# Patient Record
Sex: Female | Born: 1980 | Race: Black or African American | Hispanic: No | Marital: Married | Smoking: Never smoker
Health system: Southern US, Community
[De-identification: ages and names within clinical notes are randomized; demographics above are authoritative.]

## PROBLEM LIST (undated history)

## (undated) DIAGNOSIS — B999 Unspecified infectious disease: Secondary | ICD-10-CM

## (undated) HISTORY — PX: NO PAST SURGERIES: SHX2092

## (undated) HISTORY — DX: Unspecified infectious disease: B99.9

---

## 2010-09-23 LAB — HIV ANTIBODY (ROUTINE TESTING W REFLEX): HIV: NONREACTIVE

## 2010-09-23 LAB — HEPATITIS B SURFACE ANTIGEN: Hepatitis B Surface Ag: NEGATIVE

## 2010-09-23 LAB — GC/CHLAMYDIA PROBE AMP, GENITAL
Chlamydia: NEGATIVE
Gonorrhea: NEGATIVE

## 2010-09-23 LAB — ABO/RH: RH Type: POSITIVE

## 2010-09-23 LAB — RUBELLA ANTIBODY, IGM: Rubella: IMMUNE

## 2010-09-23 LAB — TYPE AND SCREEN: Antibody Screen: NEGATIVE

## 2010-10-20 NOTE — L&D Delivery Note (Signed)
Pt had a protracted early phase of labor.  Once in the active phase she progressed along a nl labor curve.  Second stage was complicated by variable decels. She had a svd over a 2nd degree perineal tear in ROA position.  Placenta S/I. EBL 400cc. Baby to NBN.

## 2010-11-24 ENCOUNTER — Inpatient Hospital Stay (HOSPITAL_COMMUNITY)
Admission: AD | Admit: 2010-11-24 | Discharge: 2010-11-24 | Disposition: A | Discharge status: Home / Self Care | Payer: 59 | Source: Ambulatory Visit | Attending: Obstetrics and Gynecology | Admitting: Obstetrics and Gynecology

## 2010-11-24 DIAGNOSIS — O2650 Maternal hypotension syndrome, unspecified trimester: Secondary | ICD-10-CM | POA: Insufficient documentation

## 2010-11-24 LAB — URINALYSIS, ROUTINE W REFLEX MICROSCOPIC
Bilirubin Urine: NEGATIVE
Hgb urine dipstick: NEGATIVE
Ketones, ur: NEGATIVE mg/dL
Leukocytes, UA: NEGATIVE
Nitrite: NEGATIVE
Protein, ur: 100 mg/dL — AB
Specific Gravity, Urine: 1.025 (ref 1.005–1.030)
Urine Glucose, Fasting: NEGATIVE mg/dL
Urobilinogen, UA: 0.2 mg/dL (ref 0.0–1.0)
pH: 6.5 (ref 5.0–8.0)

## 2010-11-24 LAB — URINE MICROSCOPIC-ADD ON

## 2010-11-27 LAB — URINE CULTURE
Colony Count: >=100000
Culture  Setup Time: 201202061002

## 2010-12-30 ENCOUNTER — Other Ambulatory Visit (HOSPITAL_COMMUNITY): Payer: Self-pay | Admitting: Obstetrics & Gynecology

## 2010-12-30 DIAGNOSIS — IMO0002 Reserved for concepts with insufficient information to code with codable children: Secondary | ICD-10-CM

## 2010-12-30 DIAGNOSIS — Z0489 Encounter for examination and observation for other specified reasons: Secondary | ICD-10-CM

## 2011-01-02 ENCOUNTER — Ambulatory Visit (HOSPITAL_COMMUNITY)
Admission: RE | Admit: 2011-01-02 | Discharge: 2011-01-02 | Disposition: A | Discharge status: Home / Self Care | Payer: 59 | Source: Ambulatory Visit | Attending: Obstetrics & Gynecology | Admitting: Obstetrics & Gynecology

## 2011-01-02 DIAGNOSIS — Z0489 Encounter for examination and observation for other specified reasons: Secondary | ICD-10-CM

## 2011-01-02 DIAGNOSIS — O358XX0 Maternal care for other (suspected) fetal abnormality and damage, not applicable or unspecified: Secondary | ICD-10-CM | POA: Insufficient documentation

## 2011-01-02 DIAGNOSIS — IMO0002 Reserved for concepts with insufficient information to code with codable children: Secondary | ICD-10-CM

## 2011-01-02 DIAGNOSIS — Z363 Encounter for antenatal screening for malformations: Secondary | ICD-10-CM | POA: Insufficient documentation

## 2011-01-02 DIAGNOSIS — Z1389 Encounter for screening for other disorder: Secondary | ICD-10-CM | POA: Insufficient documentation

## 2011-01-28 LAB — RPR
RPR: NONREACTIVE
RPR: NONREACTIVE

## 2011-03-25 IMAGING — US US OB DETAIL+14 WK
2 series · 14 of 28 positions shown · non-contrast
Comparison: none

[Series 1: us ob detail+14 wk · 1 of 7 slices shown (1 of 2)]
[im 7/7]
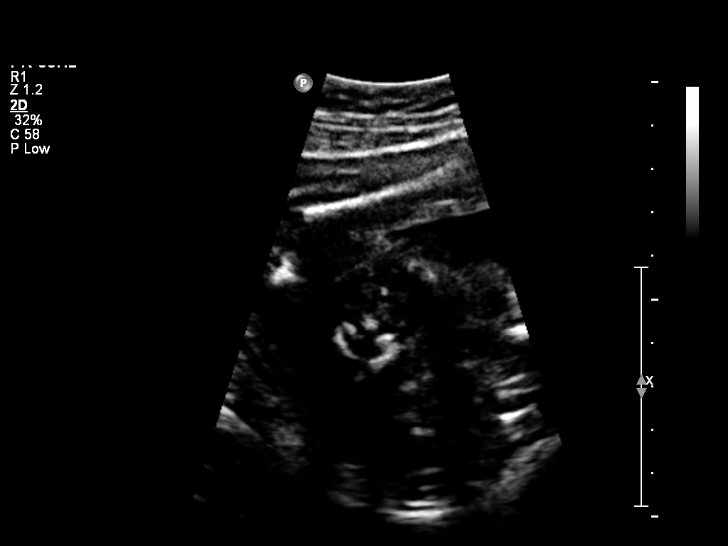

[Series 1: us ob detail+14 wk · 77 acquisitions, 13 frames shown (2 of 2)]
[im 4/77]
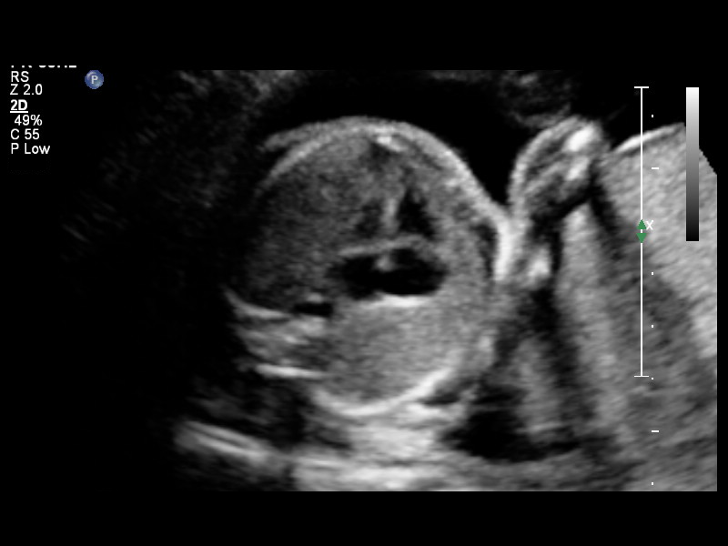
[im 10/77]
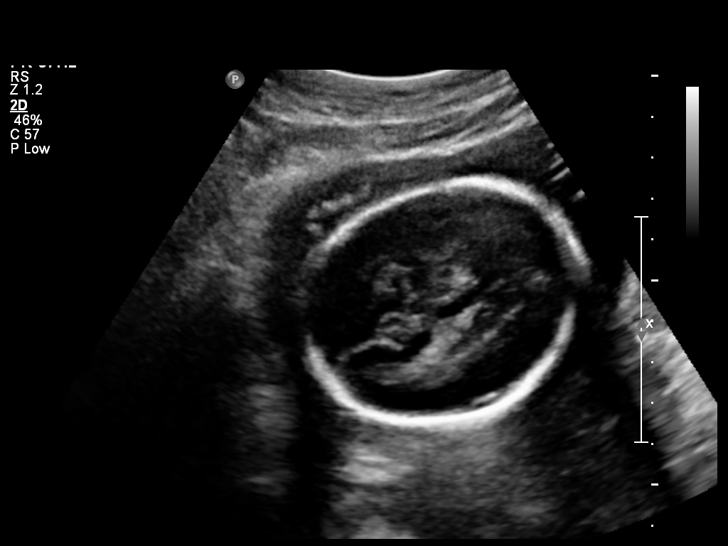
[im 16/77]
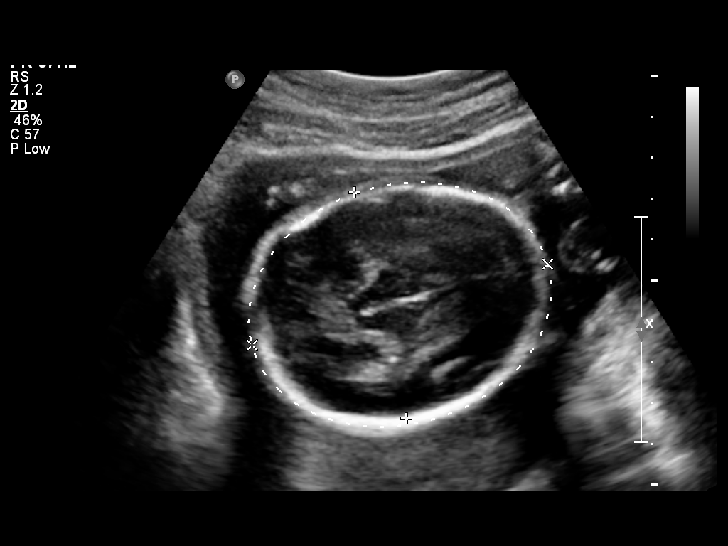
[im 22/77]
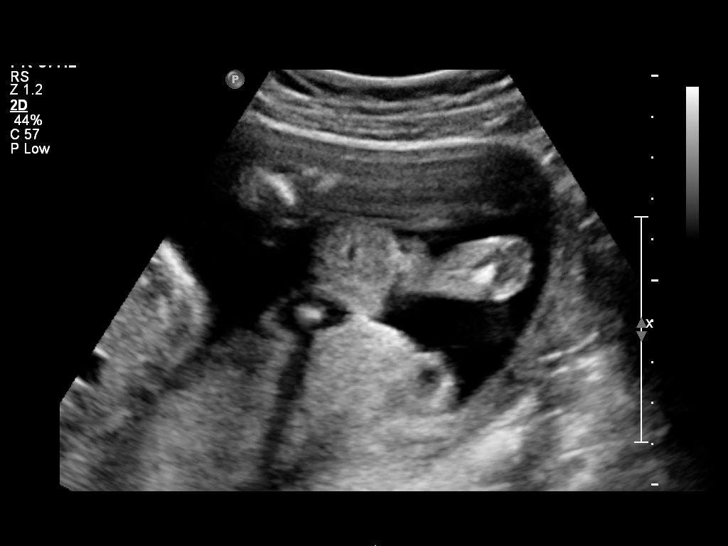
[im 28/77]
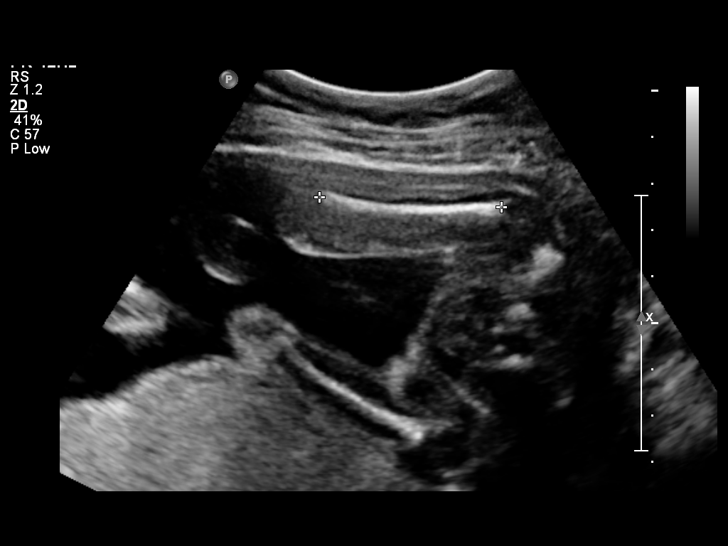
[im 34/77]
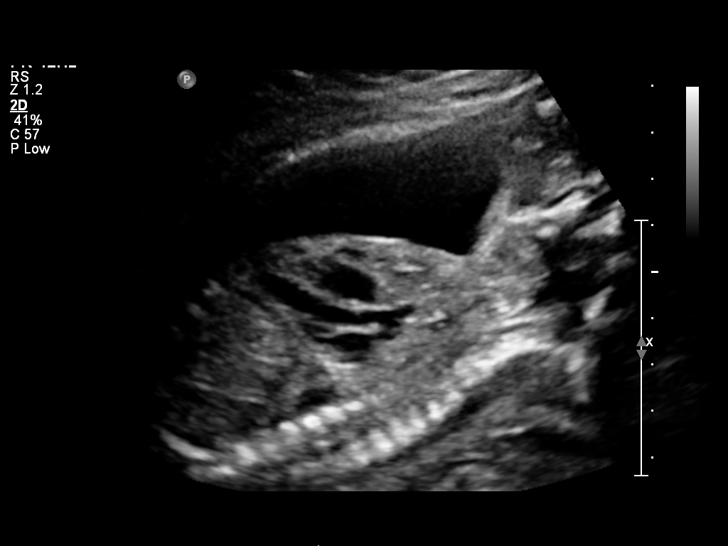
[im 40/77]
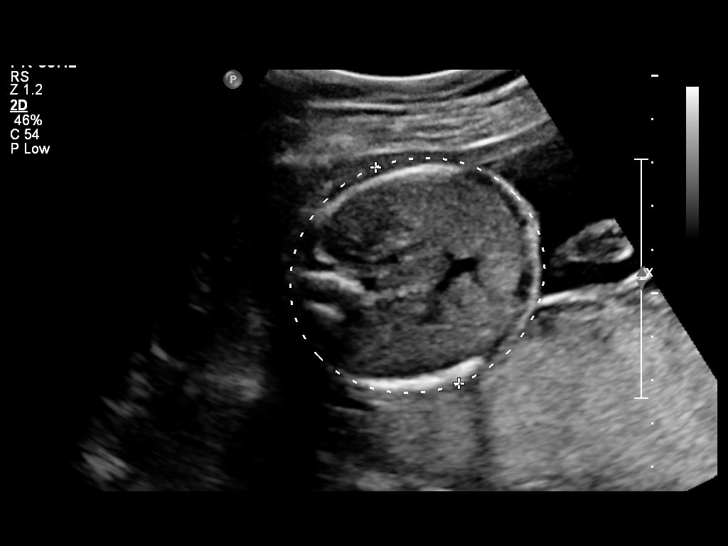
[im 46/77]
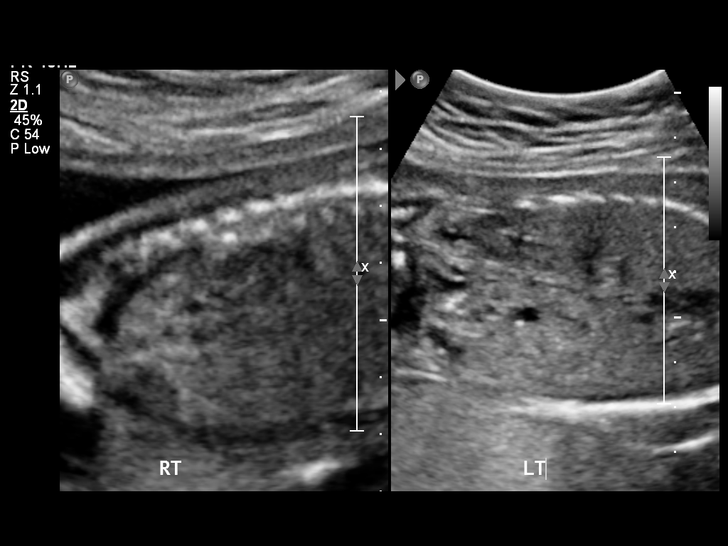
[im 52/77]
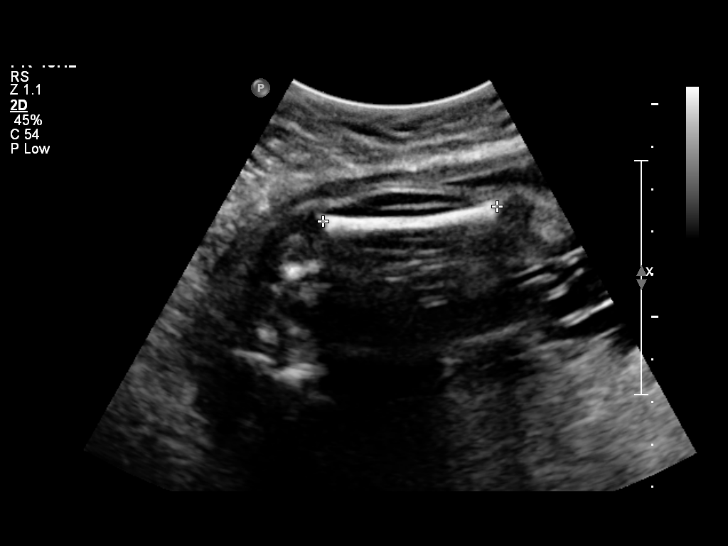
[im 58/77]
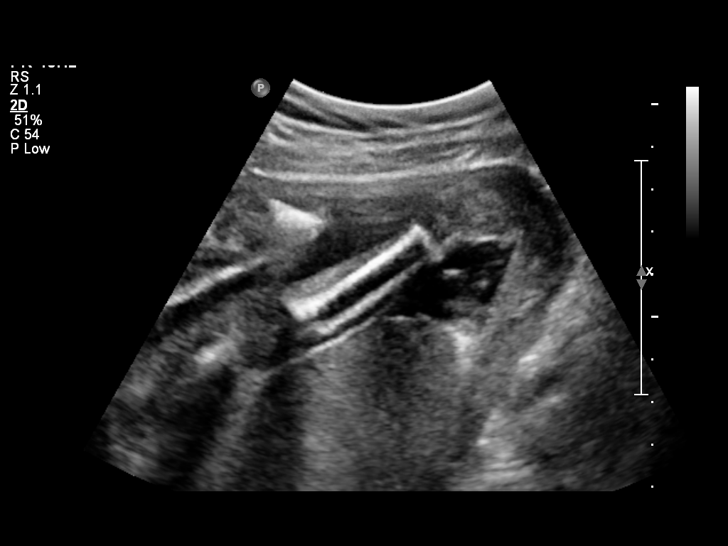
[im 64/77]
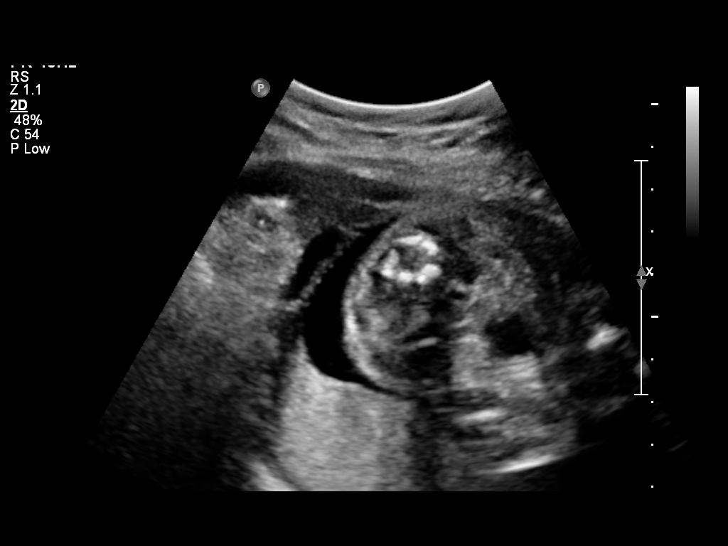
[im 70/77]
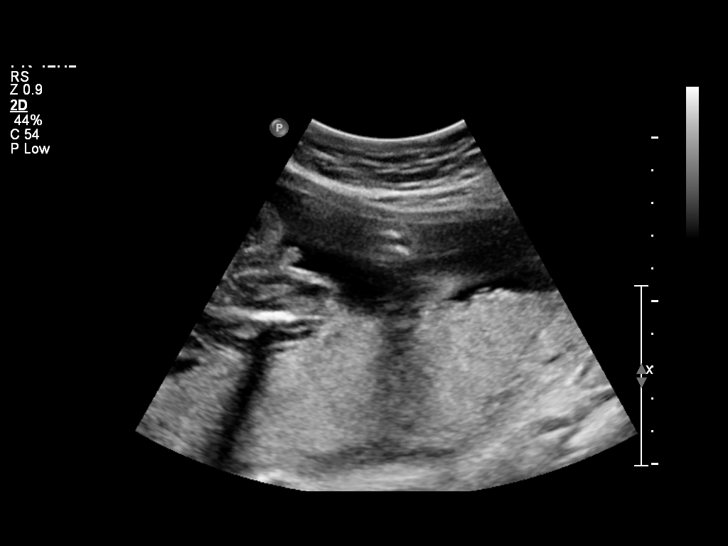
[im 77/77]
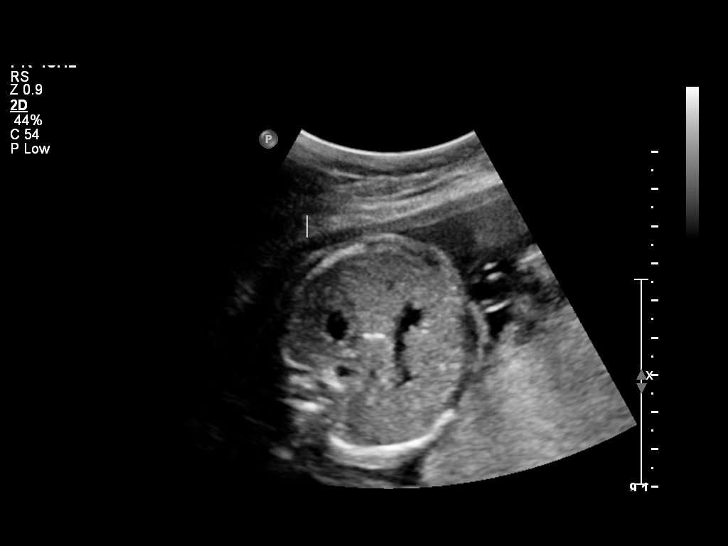

[14 of 28 positions shown; findings below may reference images not displayed]

Canned report from images found in remote index.

Refer to host system for actual result text.

## 2011-03-26 LAB — STREP B DNA PROBE
GBS: NEGATIVE
GBS: NEGATIVE
GBS: NEGATIVE
GBS: NEGATIVE
GBS: NEGATIVE

## 2011-05-04 ENCOUNTER — Inpatient Hospital Stay (HOSPITAL_COMMUNITY): Admission: RE | Admit: 2011-05-04 | Payer: 59 | Source: Ambulatory Visit

## 2011-05-04 ENCOUNTER — Encounter (HOSPITAL_COMMUNITY): Payer: Self-pay

## 2011-05-04 ENCOUNTER — Inpatient Hospital Stay (HOSPITAL_COMMUNITY)
Admission: AD | Admit: 2011-05-04 | Discharge: 2011-05-08 | DRG: 775 | Disposition: A | Discharge status: Home / Self Care | Payer: 59 | Source: Ambulatory Visit | Attending: Obstetrics and Gynecology | Admitting: Obstetrics and Gynecology

## 2011-05-04 LAB — CBC
HCT: 34.9 % — ABNORMAL LOW (ref 36.0–46.0)
Hemoglobin: 11.6 g/dL — ABNORMAL LOW (ref 12.0–15.0)
MCH: 29.6 pg (ref 26.0–34.0)
MCHC: 33.2 g/dL (ref 30.0–36.0)
MCV: 89 fL (ref 78.0–100.0)
Platelets: 265 10*3/uL (ref 150–400)
RBC: 3.92 MIL/uL (ref 3.87–5.11)
RDW: 13.8 % (ref 11.5–15.5)
WBC: 14 10*3/uL — ABNORMAL HIGH (ref 4.0–10.5)

## 2011-05-04 LAB — STREP B DNA PROBE
GBS: NEGATIVE
GBS: NEGATIVE

## 2011-05-04 LAB — ABO/RH

## 2011-05-04 MED ORDER — PHENYLEPHRINE 40 MCG/ML (10ML) SYRINGE FOR IV PUSH (FOR BLOOD PRESSURE SUPPORT)
80.0000 ug | PREFILLED_SYRINGE | INTRAVENOUS | Status: DC | PRN
  Filled 2011-05-04: qty 5

## 2011-05-04 MED ORDER — PHENYLEPHRINE 40 MCG/ML (10ML) SYRINGE FOR IV PUSH (FOR BLOOD PRESSURE SUPPORT)
80.0000 ug | PREFILLED_SYRINGE | INTRAVENOUS | Status: DC | PRN
  Filled 2011-05-04 (×2): qty 5

## 2011-05-04 MED ORDER — ACETAMINOPHEN 325 MG PO TABS: 650.0000 mg | ORAL_TABLET | ORAL | Status: DC | PRN

## 2011-05-04 MED ORDER — LACTATED RINGERS IV SOLN: 500.0000 mL | INTRAVENOUS | Status: DC | PRN

## 2011-05-04 MED ORDER — LACTATED RINGERS IV SOLN
500.0000 mL | Freq: Once | INTRAVENOUS | Status: AC
  Administered 2011-05-06: 500 mL via INTRAVENOUS

## 2011-05-04 MED ORDER — LIDOCAINE HCL (PF) 1 % IJ SOLN
30.0000 mL | INTRAMUSCULAR | Status: DC | PRN
  Filled 2011-05-04: qty 30

## 2011-05-04 MED ORDER — DIPHENHYDRAMINE HCL 50 MG/ML IJ SOLN: 12.5000 mg | INTRAMUSCULAR | Status: DC | PRN

## 2011-05-04 MED ORDER — MISOPROSTOL 25 MCG QUARTER TABLET
25.0000 ug | ORAL_TABLET | ORAL | Status: DC
  Administered 2011-05-04 – 2011-05-05 (×2): 25 ug via VAGINAL
  Filled 2011-05-04 (×4): qty 0.25

## 2011-05-04 MED ORDER — IBUPROFEN 600 MG PO TABS: 600.0000 mg | ORAL_TABLET | Freq: Four times a day (QID) | ORAL | Status: DC | PRN

## 2011-05-04 MED ORDER — ONDANSETRON HCL 4 MG/2ML IJ SOLN: 4.0000 mg | Freq: Four times a day (QID) | INTRAMUSCULAR | Status: DC | PRN

## 2011-05-04 MED ORDER — SODIUM CHLORIDE 0.9 % IJ SOLN
3.0000 mL | INTRAMUSCULAR | Status: DC | PRN
  Administered 2011-05-04: 3 mL via INTRAVENOUS

## 2011-05-04 MED ORDER — NALBUPHINE SYRINGE 5 MG/0.5 ML
5.0000 mg | INJECTION | INTRAMUSCULAR | Status: DC | PRN
  Administered 2011-05-05 (×2): 5 mg via INTRAVENOUS
  Filled 2011-05-04 (×3): qty 0.5

## 2011-05-04 MED ORDER — OXYTOCIN 20 UNITS IN LACTATED RINGERS INFUSION - SIMPLE
125.0000 mL/h | Freq: Once | INTRAVENOUS | Status: AC
  Administered 2011-05-06: 999 mL/h via INTRAVENOUS

## 2011-05-04 MED ORDER — SODIUM CHLORIDE 0.9 % IV SOLN: 250.0000 mL | INTRAVENOUS | Status: DC

## 2011-05-04 MED ORDER — CITRIC ACID-SODIUM CITRATE 334-500 MG/5ML PO SOLN: 30.0000 mL | ORAL | Status: DC | PRN

## 2011-05-04 MED ORDER — FENTANYL 2.5 MCG/ML BUPIVACAINE 1/10 % EPIDURAL INFUSION (WH - ANES)
14.0000 mL/h | INTRAMUSCULAR | Status: DC
  Administered 2011-05-06: 14 mL/h via EPIDURAL
  Filled 2011-05-04 (×2): qty 60

## 2011-05-04 MED ORDER — EPHEDRINE 5 MG/ML INJ
10.0000 mg | INTRAVENOUS | Status: DC | PRN
  Filled 2011-05-04 (×2): qty 4

## 2011-05-04 MED ORDER — ZOLPIDEM TARTRATE 10 MG PO TABS: 10.0000 mg | ORAL_TABLET | Freq: Every evening | ORAL | Status: DC | PRN

## 2011-05-04 MED ORDER — EPHEDRINE 5 MG/ML INJ
10.0000 mg | INTRAVENOUS | Status: DC | PRN
  Filled 2011-05-04: qty 4

## 2011-05-04 MED ORDER — FLEET ENEMA 7-19 GM/118ML RE ENEM: 1.0000 | ENEMA | RECTAL | Status: DC | PRN

## 2011-05-04 MED ORDER — SODIUM CHLORIDE 0.9 % IJ SOLN: 3.0000 mL | Freq: Two times a day (BID) | INTRAMUSCULAR | Status: DC

## 2011-05-04 MED ORDER — LACTATED RINGERS IV SOLN
INTRAVENOUS | Status: DC
  Administered 2011-05-05: 125 mL/h via INTRAVENOUS
  Administered 2011-05-06: 03:00:00 via INTRAVENOUS

## 2011-05-04 NOTE — Plan of Care (Signed)
Problem: Consults Goal: Birthing Suites Patient Information Press F2 to bring up selections list  Outcome: Completed/Met Date Met:  05/04/11  Pt > [redacted] weeks EGA and Inpatient induction

## 2011-05-05 LAB — CBC
HCT: 36.5 % (ref 36.0–46.0)
Hemoglobin: 12 g/dL (ref 12.0–15.0)
MCH: 29.3 pg (ref 26.0–34.0)
MCHC: 32.9 g/dL (ref 30.0–36.0)
MCV: 89 fL (ref 78.0–100.0)
Platelets: 237 10*3/uL (ref 150–400)
RBC: 4.1 MIL/uL (ref 3.87–5.11)
RDW: 13.7 % (ref 11.5–15.5)
WBC: 18.3 10*3/uL — ABNORMAL HIGH (ref 4.0–10.5)

## 2011-05-05 LAB — RPR: RPR Ser Ql: NONREACTIVE

## 2011-05-05 MED ORDER — TERBUTALINE SULFATE 1 MG/ML IJ SOLN: 0.2500 mg | Freq: Once | INTRAMUSCULAR | Status: AC | PRN

## 2011-05-05 MED ORDER — OXYTOCIN 20 UNITS IN LACTATED RINGERS INFUSION - SIMPLE
1.0000 m[IU]/min | INTRAVENOUS | Status: DC
  Administered 2011-05-05: 2 m[IU]/min via INTRAVENOUS
  Administered 2011-05-06: 12 m[IU]/min via INTRAVENOUS
  Administered 2011-05-06: 14 m[IU]/min via INTRAVENOUS
  Filled 2011-05-05: qty 1000

## 2011-05-05 NOTE — Anesthesia Preprocedure Evaluation (Addendum)
Anesthesia Evaluation  Name, MR# and DOB Patient awake  General Assessment Comment  Reviewed: Allergy & Precautions, H&P  and Patient's Chart, lab work & pertinent test results  Airway Mallampati: II TM Distance: >3 FB Neck ROM: full    Dental  (+) Teeth Intact   Pulmonary  clear to auscultation    Cardiovascular regular Normal   Neuro/Psych  GI/Hepatic/Renal   Endo/Other   Abdominal   Musculoskeletal  Hematology   Peds  Reproductive/Obstetrics (+) Pregnancy   Anesthesia Other Findings   12/36 237k Plts              Anesthesia Physical Anesthesia Plan  ASA: II  Anesthesia Plan: Epidural   Post-op Pain Management:    Induction:   Airway Management Planned:   Additional Equipment:   Intra-op Plan:   Post-operative Plan:   Informed Consent: I have reviewed the patients History and Physical, chart, labs and discussed the procedure including the risks, benefits and alternatives for the proposed anesthesia with the patient or authorized representative who has indicated his/her understanding and acceptance.   Dental Advisory Given  Plan Discussed with: CRNA and Surgeon  Anesthesia Plan Comments: (Labs checked- platelets confirmed with RN in room. Fetal heart tracing, per RN, reportedly stable enough for sitting procedure. Discussed epidural, and patient consents to the procedure:  included risk of possible headache,backache, failed block, allergic reaction, and nerve injury. This patient was asked if she had any questions or concerns before the procedure started. )        Anesthesia Quick Evaluation

## 2011-05-05 NOTE — H&P (Signed)
H & P  Pt is a 30y/o BF G1P0 EDC 04/28/11 who is admitted for induction secondary to post term pregnancy.  Pt had a scan around 37 weeks which revealed an EFW <10%, with no AFI and Dopplers.  She was followed with biweekly nsts.  At 39 weeks the growth improved to the 28%.    PE VSSAF HEENT - wnl Abd- gravid, nt, no ctxs. Cx- 50/1/-2 vtx FHTs - reactive   PMHx- see Hol[facility]  IMP/IUP at 41 weeks PLAN/ Start induction

## 2011-05-06 ENCOUNTER — Encounter (HOSPITAL_COMMUNITY): Payer: Self-pay | Admitting: Anesthesiology

## 2011-05-06 ENCOUNTER — Encounter (HOSPITAL_COMMUNITY): Payer: Self-pay | Admitting: *Deleted

## 2011-05-06 ENCOUNTER — Inpatient Hospital Stay (HOSPITAL_COMMUNITY): Payer: 59 | Admitting: Anesthesiology

## 2011-05-06 MED ORDER — TETANUS-DIPHTH-ACELL PERTUSSIS 5-2.5-18.5 LF-MCG/0.5 IM SUSP
0.5000 mL | Freq: Once | INTRAMUSCULAR | Status: DC
  Filled 2011-05-06: qty 0.5

## 2011-05-06 MED ORDER — MEASLES, MUMPS & RUBELLA VAC ~~LOC~~ INJ
0.5000 mL | INJECTION | Freq: Once | SUBCUTANEOUS | Status: DC
  Filled 2011-05-06: qty 0.5

## 2011-05-06 MED ORDER — ZOLPIDEM TARTRATE 5 MG PO TABS: 5.0000 mg | ORAL_TABLET | Freq: Every evening | ORAL | Status: DC | PRN

## 2011-05-06 MED ORDER — IBUPROFEN 600 MG PO TABS
600.0000 mg | ORAL_TABLET | Freq: Four times a day (QID) | ORAL | Status: DC
  Administered 2011-05-06 – 2011-05-08 (×9): 600 mg via ORAL
  Filled 2011-05-06 (×9): qty 1

## 2011-05-06 MED ORDER — WITCH HAZEL-GLYCERIN EX PADS: MEDICATED_PAD | CUTANEOUS | Status: DC | PRN

## 2011-05-06 MED ORDER — SIMETHICONE 80 MG PO CHEW: 80.0000 mg | CHEWABLE_TABLET | ORAL | Status: DC | PRN

## 2011-05-06 MED ORDER — BENZOCAINE-MENTHOL 20-0.5 % EX AERO
1.0000 "application " | INHALATION_SPRAY | CUTANEOUS | Status: DC | PRN
  Administered 2011-05-06 – 2011-05-08 (×2): 1 via TOPICAL

## 2011-05-06 MED ORDER — ONDANSETRON HCL 4 MG PO TABS: 4.0000 mg | ORAL_TABLET | ORAL | Status: DC | PRN

## 2011-05-06 MED ORDER — BENZOCAINE-MENTHOL 20-0.5 % EX AERO
INHALATION_SPRAY | CUTANEOUS | Status: AC
  Administered 2011-05-06: 1 via TOPICAL
  Filled 2011-05-06: qty 56

## 2011-05-06 MED ORDER — ONDANSETRON HCL 4 MG/2ML IJ SOLN: 4.0000 mg | INTRAMUSCULAR | Status: DC | PRN

## 2011-05-06 MED ORDER — LIDOCAINE HCL (PF) 2 % IJ SOLN
INTRAMUSCULAR | Status: DC | PRN
  Administered 2011-05-06: 30 mg
  Administered 2011-05-06: 50 mg
  Administered 2011-05-06: 40 mg

## 2011-05-06 NOTE — Anesthesia Procedure Notes (Addendum)
Epidural Patient location during procedure: OB Start time: 05/06/2011 12:24 AM  Staffing Anesthesiologist: Jiles Garter  Preanesthetic Checklist Completed: patient identified, site marked, surgical consent, pre-op evaluation, timeout performed, IV checked, risks and benefits discussed and monitors and equipment checked  Epidural Patient position: sitting Prep: DuraPrep Patient monitoring: continuous pulse ox and blood pressure Approach: midline Injection technique: LOR air  Needle:  Needle type: Tuohy  Needle gauge: 17 G Needle length: 9 cm Needle insertion depth: 5 cm cm Catheter type: closed end flexible Catheter size: 19 Gauge Catheter at skin depth: 10 cm Test dose: negative  Assessment Events: blood not aspirated, injection not painful, no injection resistance, negative IV test and no paresthesia  Additional Notes Patient is more comfortable after epidural dosed. Please see RN's note for documentation of vital signs,and FHR which are stable.

## 2011-05-06 NOTE — Progress Notes (Signed)
Baby transferred to NICU for temperature instability an increase respirations. Mom reports baby did latch at least 2 times before being transferred. RN set up DEBP and mom has pumped 1 time. Reviewed lactation brochure, NICU guidelines for pumping and storage reviewed. Discussed community resources for breastfeeding mothers. Advised on outpatient services. Enc to pump every 3 hours for 15 minutes to encourage milk production.

## 2011-05-07 LAB — CBC
HCT: 26.6 % — ABNORMAL LOW (ref 36.0–46.0)
Hemoglobin: 8.8 g/dL — ABNORMAL LOW (ref 12.0–15.0)
MCH: 29.8 pg (ref 26.0–34.0)
MCHC: 33.1 g/dL (ref 30.0–36.0)
MCV: 90.2 fL (ref 78.0–100.0)
Platelets: 205 10*3/uL (ref 150–400)
RBC: 2.95 MIL/uL — ABNORMAL LOW (ref 3.87–5.11)
RDW: 14 % (ref 11.5–15.5)
WBC: 19.4 10*3/uL — ABNORMAL HIGH (ref 4.0–10.5)

## 2011-05-07 NOTE — Progress Notes (Signed)
  Post Partum Day 1 Subjective: Patient not in room.  Baby in NICU for observation  Objective: Blood pressure 98/62, pulse 76, temperature 98 F (36.7 C), temperature source Oral, resp. rate 16, height 5\' 6"  (1.676 m), weight 78.019 kg (172 lb), SpO2 100.00%, unknown if currently breastfeeding.   Basename 05/06/11 0418  HGB 8.8  WBC 19.4    Assessment/Plan: Plan for discharge tomorrow   LOS: 1 day   Rebekah Bartlett D 05/07/2011, 2:01 AM

## 2011-05-07 NOTE — Progress Notes (Signed)
  Patient has returned to room.  Feels fine, no complaints.  Baby is doing well in NICU.  Being observed with TTN.

## 2011-05-07 NOTE — Progress Notes (Signed)
Baby in NICU. Mom is pumping every 3 hours but not obtaining any milk. Reassurance given. Enc to continue pumping to promote supply. No questions at present.

## 2011-05-08 MED ORDER — BENZOCAINE-MENTHOL 20-0.5 % EX AERO
INHALATION_SPRAY | CUTANEOUS | Status: AC
  Administered 2011-05-08: 1 via TOPICAL
  Filled 2011-05-08: qty 56

## 2011-05-08 NOTE — Discharge Summary (Addendum)
Obstetric Discharge Summary Reason for Admission: onset of labor Prenatal Procedures: none Intrapartum Procedures: spontaneous vaginal delivery Postpartum Procedures: none Complications-Operative and Postpartum: 2 degree perineal laceration  Hemoglobin  Date Value Range Status  05/07/2011 8.8* 12.0-15.0 (g/dL) Final     DELTA CHECK NOTED     REPEATED TO VERIFY     HCT  Date Value Range Status  05/07/2011 26.6* 36.0-46.0 (%) Final    Discharge Diagnoses: Term Pregnancy-delivered  Discharge Information: Date: 05/08/2011 Activity: pelvic rest Diet: routine Medications: Ibuprophen Condition: stable Instructions: refer to practice specific booklet Discharge to: home Follow-up Information    Make an appointment in 4 weeks to follow up.         Newborn Data: Live born  Information for the patient's newborn:  Aryani, Daffern [[id number]]  female ; APGAR , ; weight ;  Home with mother.  Rebekah Bartlett A 05/08/2011, 9:28 AM

## 2011-05-08 NOTE — Progress Notes (Addendum)
Patient is eating, ambulating, voiding.  Pain control is good.  Filed Vitals:   05/07/11 0507 05/07/11 1353 05/07/11 2138 05/08/11 0552  BP: 94/63 95/62 103/65 84/55  Pulse: 103 87 89 89  Temp: 98.3 F (36.8 C) 98.5 F (36.9 C) 98.2 F (36.8 C) 98.3 F (36.8 C)  TempSrc: Oral Oral Oral Oral  Resp: 18 18 18 18   Height:      Weight:      SpO2:        Fundus firm Perineum without swelling.  Lab Results  Component Value Date   WBC 19.4* 05/07/2011   HGB 8.8* 05/07/2011   HCT 26.6* 05/07/2011   MCV 90.2 05/07/2011   PLT 205 05/07/2011       A/P  Routine care.  Expect d/c per plan.  Iron otc.  Baby stable in NICU.  Kaegan Stigler A

## 2011-05-08 NOTE — Progress Notes (Signed)
Mother has been putting infant to breast in [place], states she is slightly sore, reviewed positioning and support. Gave comfort gels. Mom has purely yours pump, inst to bring parts for sym when she comes to the hospital. Lots of teaching, parents very receptive. Encouraged to call for lactation visit. On discharge day.

## 2011-05-08 NOTE — Progress Notes (Signed)
Infant in NICU 

## 2012-10-20 NOTE — L&D Delivery Note (Signed)
Delivery Note  At 11:09 AM a viable and healthy female ( ? ) was delivered via Vaginal, Spontaneous Delivery (Presentation: vertex;OA  ).  APGAR: 9, 9; weight .   Placenta status: Intact, Spontaneous.  Cord: 3 vessels with the following complications: None.  Anesthesia: Local  Episiotomy: None Lacerations: 2nd degree;Perineal Suture Repair: 2.0 Est. Blood Loss (mL): 500   Mom to postpartum.   Baby A to skin to skin.   Baby B to NA.  Olympia Adelsberger V 07/04/2013, 11:47 AM

## 2012-11-10 ENCOUNTER — Encounter: Payer: Self-pay | Admitting: Obstetrics and Gynecology

## 2012-11-10 ENCOUNTER — Other Ambulatory Visit: Payer: Self-pay

## 2012-11-10 ENCOUNTER — Ambulatory Visit: Payer: BC Managed Care – PPO | Admitting: Obstetrics and Gynecology

## 2012-11-10 DIAGNOSIS — Z331 Pregnant state, incidental: Secondary | ICD-10-CM

## 2012-11-10 DIAGNOSIS — D649 Anemia, unspecified: Secondary | ICD-10-CM | POA: Insufficient documentation

## 2012-11-10 LAB — POCT URINALYSIS DIPSTICK
Bilirubin, UA: NEGATIVE
Blood, UA: NEGATIVE
Glucose, UA: NEGATIVE
Ketones, UA: NEGATIVE
Leukocytes, UA: NEGATIVE
Nitrite, UA: NEGATIVE
Protein, UA: NEGATIVE
Spec Grav, UA: 1.015
Urobilinogen, UA: NEGATIVE
pH, UA: 5

## 2012-11-10 NOTE — Progress Notes (Signed)
NOB interview.  

## 2012-11-11 LAB — PRENATAL PANEL VII
Antibody Screen: NEGATIVE
Basophils Absolute: 0 10*3/uL (ref 0.0–0.1)
Basophils Relative: 0 % (ref 0–1)
Eosinophils Absolute: 0.1 10*3/uL (ref 0.0–0.7)
Eosinophils Relative: 1 % (ref 0–5)
HCT: 36.2 % (ref 36.0–46.0)
HIV: NONREACTIVE
Hemoglobin: 11.9 g/dL — ABNORMAL LOW (ref 12.0–15.0)
Hepatitis B Surface Ag: NEGATIVE
Lymphocytes Relative: 21 % (ref 12–46)
Lymphs Abs: 1.9 10*3/uL (ref 0.7–4.0)
MCH: 28.4 pg (ref 26.0–34.0)
MCHC: 32.9 g/dL (ref 30.0–36.0)
MCV: 86.4 fL (ref 78.0–100.0)
Monocytes Absolute: 0.6 10*3/uL (ref 0.1–1.0)
Monocytes Relative: 7 % (ref 3–12)
Neutro Abs: 6.4 10*3/uL (ref 1.7–7.7)
Neutrophils Relative %: 71 % (ref 43–77)
Platelets: 357 10*3/uL (ref 150–400)
RBC: 4.19 MIL/uL (ref 3.87–5.11)
RDW: 13.5 % (ref 11.5–15.5)
Rh Type: POSITIVE
Rubella: 3.46 Index — ABNORMAL HIGH (ref <0.90)
WBC: 8.9 10*3/uL (ref 4.0–10.5)

## 2012-11-11 LAB — GC/CHLAMYDIA PROBE AMP
CT Probe RNA: NEGATIVE
GC Probe RNA: NEGATIVE

## 2012-11-12 LAB — HEMOGLOBINOPATHY EVALUATION
Hemoglobin Other: 0 %
Hgb A2 Quant: 2.7 % (ref 2.2–3.2)
Hgb A: 97.3 % (ref 96.8–97.8)
Hgb F Quant: 0 % (ref 0.0–2.0)
Hgb S Quant: 0 %

## 2012-11-12 LAB — CULTURE, OB URINE: Colony Count: 7000

## 2012-12-04 ENCOUNTER — Other Ambulatory Visit: Payer: Self-pay

## 2012-12-14 DIAGNOSIS — Z885 Allergy status to narcotic agent status: Secondary | ICD-10-CM | POA: Insufficient documentation

## 2012-12-14 DIAGNOSIS — Z880 Allergy status to penicillin: Secondary | ICD-10-CM | POA: Insufficient documentation

## 2012-12-15 ENCOUNTER — Encounter: Payer: Self-pay | Admitting: Obstetrics and Gynecology

## 2012-12-15 ENCOUNTER — Ambulatory Visit: Payer: BC Managed Care – PPO | Admitting: Obstetrics and Gynecology

## 2012-12-15 VITALS — BP 90/66 | Wt 160.0 lb

## 2012-12-15 DIAGNOSIS — Z124 Encounter for screening for malignant neoplasm of cervix: Secondary | ICD-10-CM

## 2012-12-15 DIAGNOSIS — Z880 Allergy status to penicillin: Secondary | ICD-10-CM

## 2012-12-15 DIAGNOSIS — Z23 Encounter for immunization: Secondary | ICD-10-CM

## 2012-12-15 DIAGNOSIS — D649 Anemia, unspecified: Secondary | ICD-10-CM

## 2012-12-15 DIAGNOSIS — Z331 Pregnant state, incidental: Secondary | ICD-10-CM

## 2012-12-15 NOTE — Progress Notes (Signed)
[redacted]w[redacted]d Pt declines genetic screening.  Pt will get flu vaccine today.  Pap due today

## 2012-12-15 NOTE — Progress Notes (Signed)
   Rebekah Bartlett is being seen today for her first obstetrical visit at [redacted]w[redacted]d gestation by LMP.  1st baby delivered at another practice--felt she had USs without clear indications, felt pressured into induction or C/S.  Hopes to have more communication with providers during this pregnancy regarding issues and plan of care.  May want to attempt minimal medication use this labor.  She reports doing well--some fatigue.  Her obstetrical history is significant for: Patient Active Problem List  Diagnosis  . SGA (small for gestational age) infant in previous pregnancy  . H/O anemia  . 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  The following portions of the patient's history were reviewed and updated as appropriate: allergies, current medications, past family history, past medical history, past social history, past surgical history and problem list.  Review of Systems Pertinent ROS is described in HPI   Objective:   BP 90/66  Wt 160 lb (72.576 kg)  BMI 26.63 kg/m2  LMP 09/30/2012 Wt Readings from Last 1 Encounters:  12/15/12 160 lb (72.576 kg)   BMI: Body mass index is 26.63 kg/(m^2).  General: alert, cooperative and no distress HEENT: grossly normal  Thyroid: normal  Respiratory: clear to auscultation bilaterally Cardiovascular: regular rate and rhythm,  Breasts:  No dominant masses, nipples erect Gastrointestinal: soft, non-tender; no masses,  no organomegaly Extremities: extremities normal, no pain or edema Vaginal Bleeding: None  EXTERNAL GENITALIA: normal appearing vulva with no masses, tenderness or lesions VAGINA: no abnormal discharge or lesions CERVIX: no lesions or cervical motion tenderness; cervix closed, long, firm UTERUS: gravid and consistent with 11 weeks ADNEXA: no masses palpable and nontender OB EXAM  PELVIMETRY: appears adequate  FHR:  160  bpm  Assessment:    Pregnancy at  10 6/7 weeks Hx SGA infant Allergies to PCN and codeine Plan:     Prenatal panel reviewed and discussed with the patient:  yes  Pap smear collected:  yes GC/Chlamydia collected:  Negative at NOB interview 11/10/12. Wet prep:  Negative Discussion of Genetic testing options: Declines Prenatal vitamins recommended  Plan of care: Next visit:  4 weeks for ROB Other anticipated f/u:    18 week [place] for anatomy Reviewed model of care, rotation of visits with CNMs, care during labor by CNMs--patient considering working for unmedicated birth, wants CNM model of care.  Nigel Bridgeman, CNM, [place]

## 2012-12-16 LAB — PAP IG W/ RFLX HPV ASCU

## 2013-02-11 ENCOUNTER — Other Ambulatory Visit: Payer: Self-pay | Admitting: Obstetrics and Gynecology

## 2013-02-11 DIAGNOSIS — IMO0002 Reserved for concepts with insufficient information to code with codable children: Secondary | ICD-10-CM

## 2013-02-15 ENCOUNTER — Ambulatory Visit (HOSPITAL_COMMUNITY)
Admission: RE | Admit: 2013-02-15 | Discharge: 2013-02-15 | Disposition: A | Discharge status: Home / Self Care | Payer: BC Managed Care – PPO | Source: Ambulatory Visit | Attending: Obstetrics and Gynecology | Admitting: Obstetrics and Gynecology

## 2013-02-15 DIAGNOSIS — IMO0002 Reserved for concepts with insufficient information to code with codable children: Secondary | ICD-10-CM

## 2013-02-15 DIAGNOSIS — Z363 Encounter for antenatal screening for malformations: Secondary | ICD-10-CM | POA: Insufficient documentation

## 2013-02-15 DIAGNOSIS — Z1389 Encounter for screening for other disorder: Secondary | ICD-10-CM | POA: Insufficient documentation

## 2013-02-15 DIAGNOSIS — O358XX0 Maternal care for other (suspected) fetal abnormality and damage, not applicable or unspecified: Secondary | ICD-10-CM | POA: Insufficient documentation

## 2013-03-08 ENCOUNTER — Other Ambulatory Visit: Payer: Self-pay | Admitting: Obstetrics and Gynecology

## 2013-03-08 DIAGNOSIS — O358XX0 Maternal care for other (suspected) fetal abnormality and damage, not applicable or unspecified: Secondary | ICD-10-CM

## 2013-03-16 ENCOUNTER — Ambulatory Visit (HOSPITAL_COMMUNITY)
Admission: RE | Admit: 2013-03-16 | Discharge: 2013-03-16 | Disposition: A | Discharge status: Home / Self Care | Payer: BC Managed Care – PPO | Source: Ambulatory Visit | Attending: Obstetrics and Gynecology | Admitting: Obstetrics and Gynecology

## 2013-03-16 VITALS — BP 97/58 | HR 100 | Wt 169.0 lb

## 2013-03-16 DIAGNOSIS — O34219 Maternal care for unspecified type scar from previous cesarean delivery: Secondary | ICD-10-CM | POA: Insufficient documentation

## 2013-03-16 DIAGNOSIS — O358XX0 Maternal care for other (suspected) fetal abnormality and damage, not applicable or unspecified: Secondary | ICD-10-CM | POA: Insufficient documentation

## 2013-03-16 NOTE — Progress Notes (Signed)
[facility] ultrasound  Indication: 32 yr old G2P1001 at [redacted]w[redacted]d with fetus with CPAM for follow up ultrasound.  Findings: 1. Single intrauterine pregnancy. 2. Estimated fetal weight is in the 50th%. 3. Posterior placenta without evidence of previa. 4. Normal amniotic fluid volume. 5. Again see is a right sided chest mass measuring 2.6x2x1.9cm. The mass appears consistent with a CPAM. The CVR is 0.25. 6. The remainder of the limited anatomy survey is normal. 7. There is no mediastinal shift and no evidence of hydrops.  Recommendations: 1. Appropriate fetal growth. 2. CPAM: - previously counseled - will schedule consult with Pediatric Surgery - CVR is reassuring today; if <1.6 low risk of hydrops - recommend follow up in 3 weeks to reevaluate mass; future surveillance interval to be determined based on size of mass - recommend fetal echocardiogram asap; scheduled - recommend fetal growth every 3-4 weeks 3. Patient declined aneuploidy screening  Eulis Foster, MD

## 2013-04-06 ENCOUNTER — Ambulatory Visit (HOSPITAL_COMMUNITY): Payer: BC Managed Care – PPO

## 2013-06-06 IMAGING — US US OB FOLLOW-UP
1 series · 12 of 28 positions shown · non-contrast
Comparison: none

OBSTETRICS REPORT
                      (Signed Final 03/16/2013 [DATE])

Service(s) Provided
 US OB FOLLOW UP                                       76816.1
Indications
 Fetal abnormality - other known or suspected
 (chest mass)
 Previous cesarean section
Fetal Evaluation
 Num Of Fetuses:    1
 Fetal Heart Rate:  144                          bpm
 Cardiac Activity:  Observed
 Presentation:      Cephalic
 Placenta:          Posterior, above cervical
                    os
 P. Cord            Previously Visualized
 Insertion:
 Amniotic Fluid
 AFI FV:      Subjectively within normal limits
                                             Larg Pckt:     7.3  cm
Biometry
 BPD:     58.1  mm     G. Age:  23w 6d                CI:         74.3   70 - 86
 OFD:     78.2  mm                                    FL/HC:      20.2   18.7 -
 HC:     219.3  mm     G. Age:  24w 0d       37  %    HC/AC:      1.20   1.05 -
 AC:       182  mm     G. Age:  23w 0d       19  %    FL/BPD:     76.1   71 - 87
 FL:      44.2  mm     G. Age:  24w 4d       60  %    FL/AC:      24.3   20 - 24
 HUM:     40.2  mm     G. Age:  24w 3d       55  %
 CER:     25.4  mm     G. Age:  23w 3d       40  %
 Est. FW:     623  gm      1 lb 6 oz     50  %
Gestational Age
 LMP:           23w 6d        Date:  09/30/12                 EDD:   07/07/13
 U/S Today:     23w 6d                                        EDD:   07/07/13
 Best:          23w 6d     Det. By:  LMP  (09/30/12)          EDD:   07/07/13
Anatomy
 Cranium:          Appears normal         Aortic Arch:      Appears normal
 Fetal Cavum:      Previously seen        Ductal Arch:      Previously seen
 Ventricles:       Appears normal         Diaphragm:        Appears normal
 Choroid Plexus:   Appears normal         Stomach:          Appears normal
 Cerebellum:       Appears normal         Abdomen:          Appears normal
 Posterior Fossa:  Appears normal         Abdominal Wall:   Appears nml (cord
                                                            insert, abd wall)
 Nuchal Fold:      Previously seen        Cord Vessels:     Appears normal (3
                                                            vessel cord)
 Face:             Profile appears        Kidneys:          Appear normal
                   normal
 Lips:             Appears normal         Bladder:          Appears normal
 Heart:            Appears normal         Spine:            Previously seen
                   (4CH, axis, and
                   situs)
 RVOT:             Previously seen        Lower             Previously seen
                                          Extremities:
 LVOT:             Previously seen        Upper             Previously seen
 Other:  Fetus appears to be a female. Heels and 5th digit visualized.
         Nasal bone visualized. Chest mass = 4.4x4x8.4 cm.
Impression
INDICATION: 32 yr old 3BDBGGB at 27w6d with fetus with CPAM
 for follow up ultrasound.

[Series 1: us ob follow-up · 0.24mm/px · 12 of 61 slices shown]
[im 3/61]
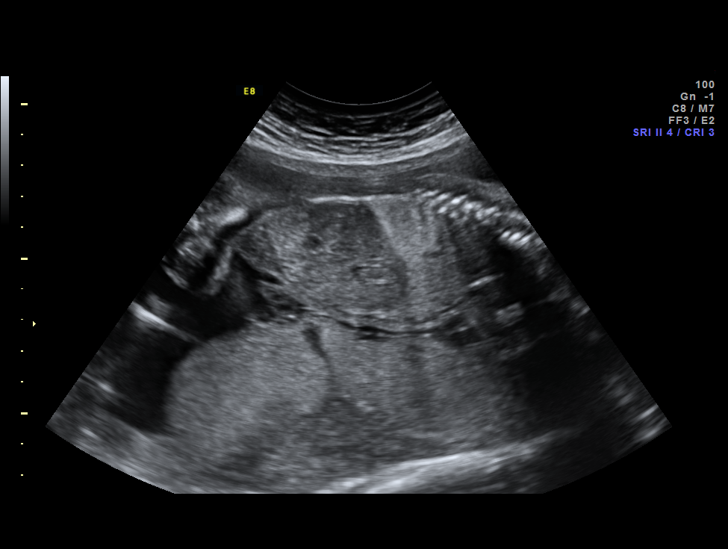
[im 7/61]
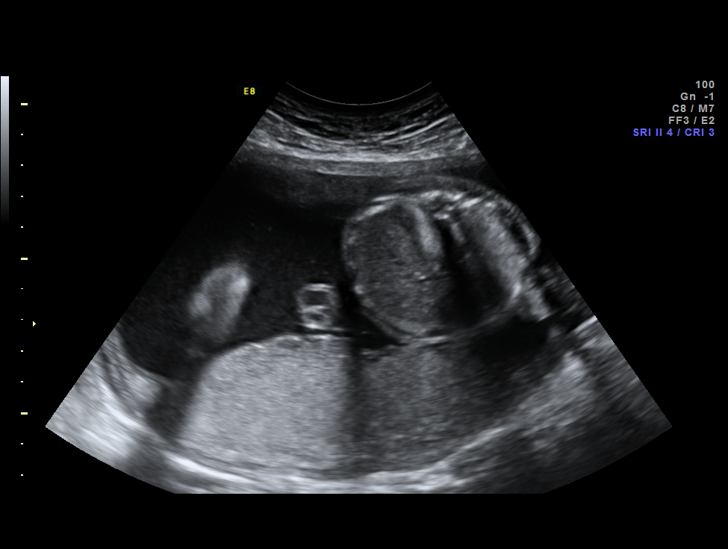
[im 12/61]
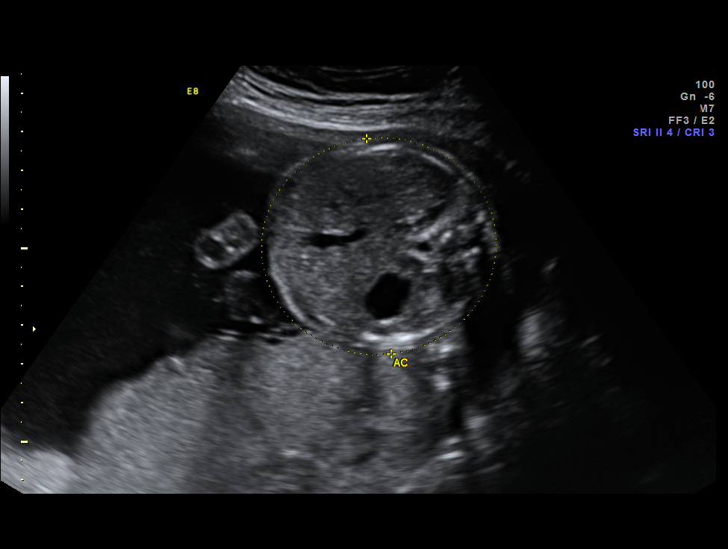
[im 18/61]
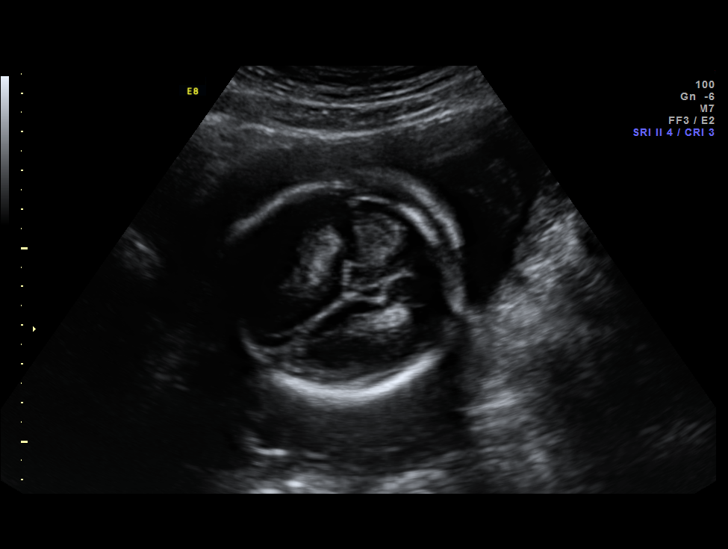
[im 23/61]
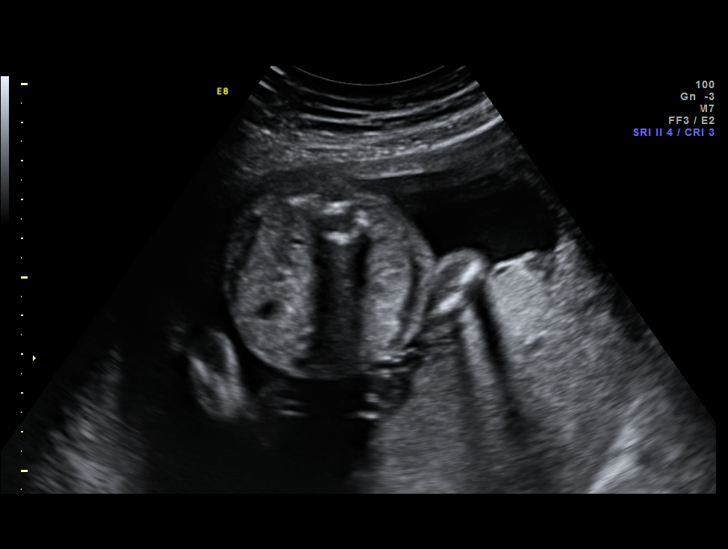
[im 27/61]
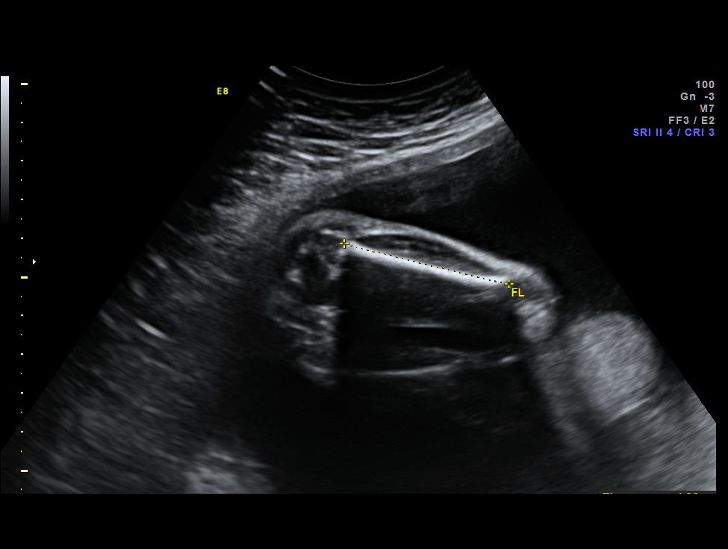
[im 34/61]
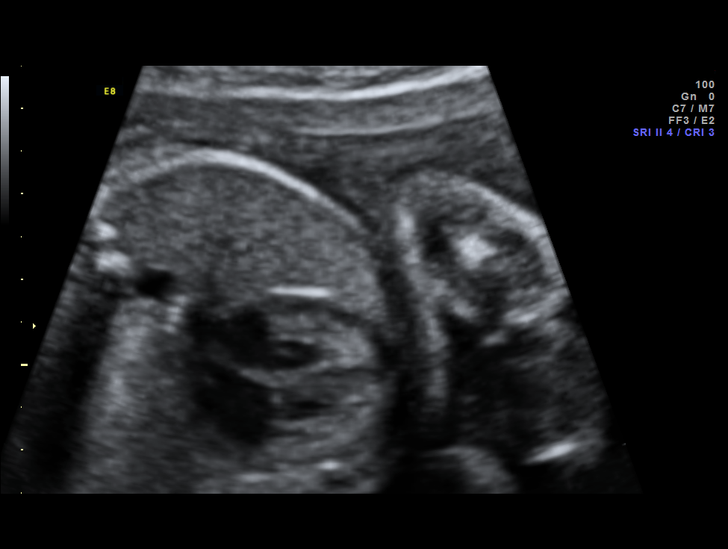
[im 38/61]
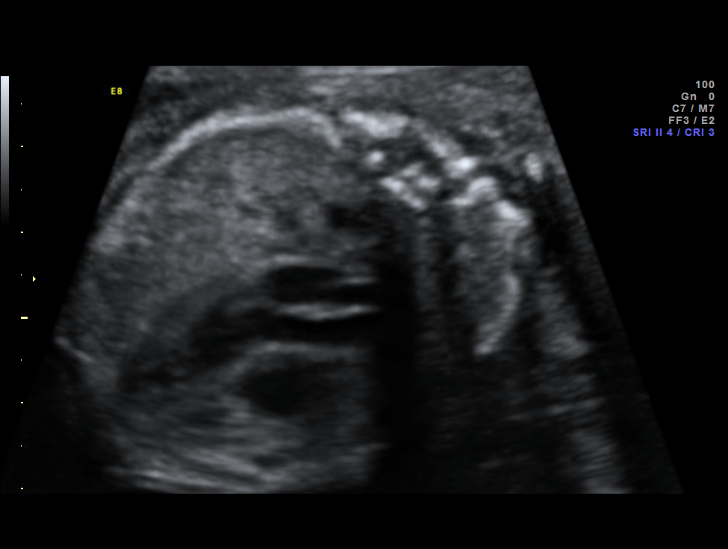
[im 43/61]
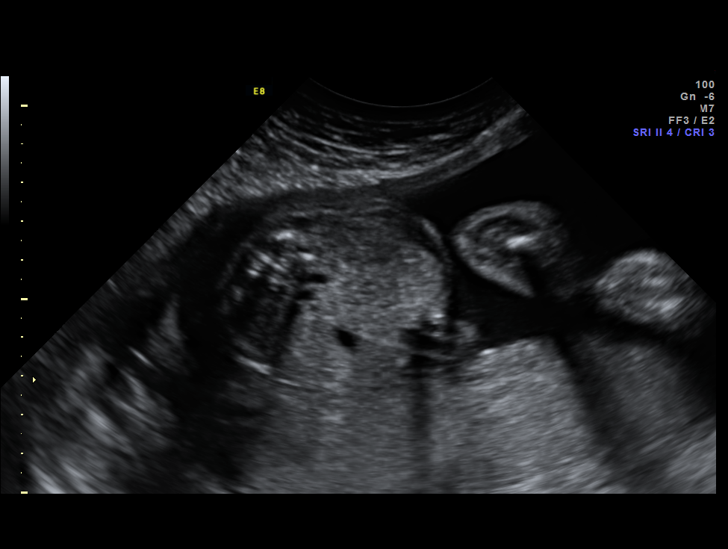
[im 49/61]
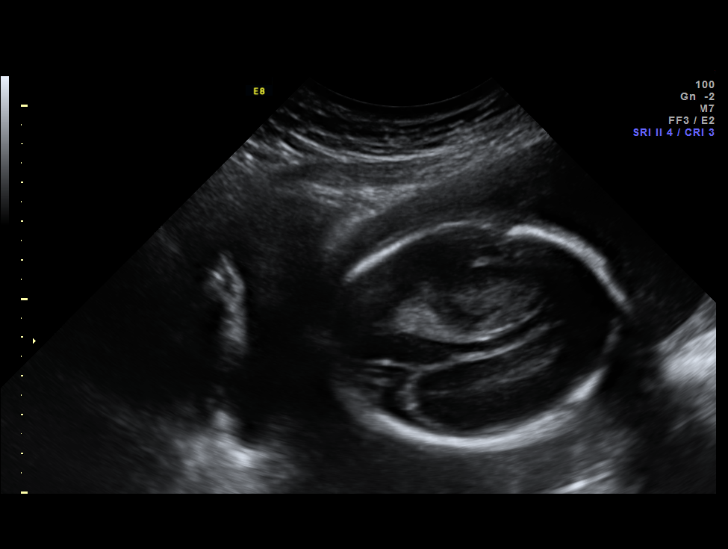
[im 54/61]
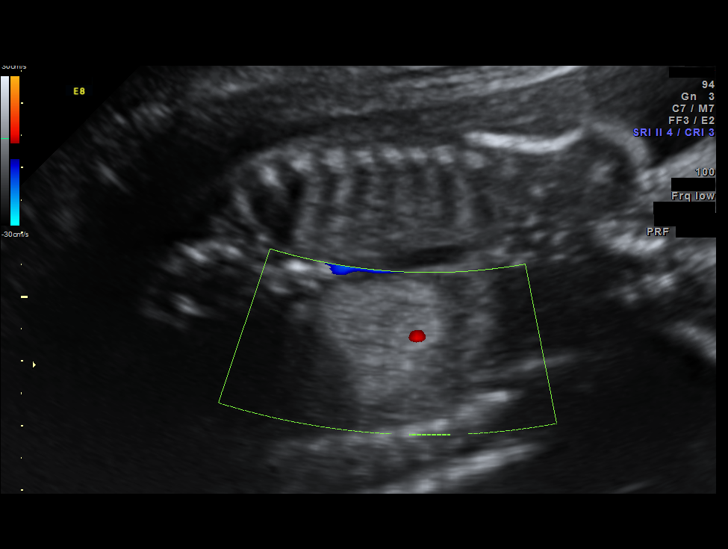
[im 58/61]
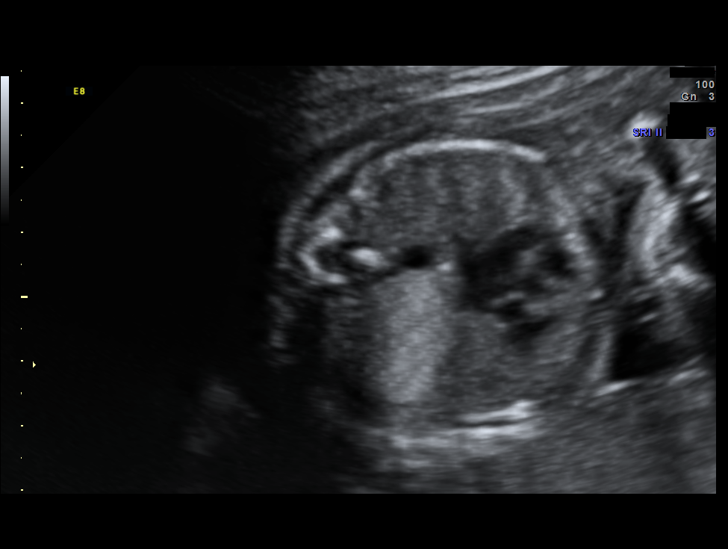

[12 of 28 positions shown; findings below may reference images not displayed]

FINDINGS: 1. Single intrauterine pregnancy.
 2. Estimated fetal weight is in the 50th%.
 3. Posterior placenta without evidence of previa.
 4. Normal amniotic fluid volume.
 5. Again see is a right sided chest mass measuring
 3.9x3x7.5cm. The mass appears consistent with a CPAM.
 The CVR is 0.25.
 6. The remainder of the limited anatomy survey is normal.
 7. There is no mediastinal shift and no evidence of hydrops.
Recommendations

 1. Appropriate fetal growth.
 2. CPAM:
 - previously counseled
 - will schedule consult with Pediatric Surgery
 - CVR is reassuring today; if <1.6 low risk of hydrops
 - recommend follow up in 3 weeks to reevaluate mass; future
 surveillance interval to be determined based on size of mass
 - recommend fetal echocardiogram asap; scheduled
 - recommend fetal growth every 3-4 weeks
 3. Patient declined aneuploidy screening

 questions or concerns.

## 2013-07-04 ENCOUNTER — Encounter (HOSPITAL_COMMUNITY): Payer: Self-pay | Admitting: *Deleted

## 2013-07-04 ENCOUNTER — Inpatient Hospital Stay (HOSPITAL_COMMUNITY)
Admission: AD | Admit: 2013-07-04 | Discharge: 2013-07-06 | DRG: 373 | Disposition: A | Discharge status: Home / Self Care | Payer: BC Managed Care – PPO | Source: Ambulatory Visit | Attending: Obstetrics and Gynecology | Admitting: Obstetrics and Gynecology

## 2013-07-04 DIAGNOSIS — O365990 Maternal care for other known or suspected poor fetal growth, unspecified trimester, not applicable or unspecified: Principal | ICD-10-CM | POA: Diagnosis present

## 2013-07-04 DIAGNOSIS — O358XX0 Maternal care for other (suspected) fetal abnormality and damage, not applicable or unspecified: Secondary | ICD-10-CM | POA: Diagnosis present

## 2013-07-04 DIAGNOSIS — D649 Anemia, unspecified: Secondary | ICD-10-CM

## 2013-07-04 DIAGNOSIS — O2650 Maternal hypotension syndrome, unspecified trimester: Secondary | ICD-10-CM | POA: Diagnosis present

## 2013-07-04 LAB — COMPREHENSIVE METABOLIC PANEL
ALT: 13 U/L (ref 0–35)
AST: 19 U/L (ref 0–37)
Albumin: 2.4 g/dL — ABNORMAL LOW (ref 3.5–5.2)
Alkaline Phosphatase: 78 U/L (ref 39–117)
BUN: 4 mg/dL — ABNORMAL LOW (ref 6–23)
CO2: 22 mEq/L (ref 19–32)
Calcium: 8.6 mg/dL (ref 8.4–10.5)
Chloride: 96 mEq/L (ref 96–112)
Creatinine, Ser: 0.43 mg/dL — ABNORMAL LOW (ref 0.50–1.10)
GFR calc Af Amer: >90 mL/min (ref >90)
GFR calc non Af Amer: >90 mL/min (ref >90)
Glucose, Bld: 118 mg/dL — ABNORMAL HIGH (ref 70–99)
Potassium: 3.6 mEq/L (ref 3.5–5.1)
Sodium: 130 mEq/L — ABNORMAL LOW (ref 135–145)
Total Bilirubin: 0.3 mg/dL (ref 0.3–1.2)
Total Protein: 5.8 g/dL — ABNORMAL LOW (ref 6.0–8.3)

## 2013-07-04 LAB — PROTEIN / CREATININE RATIO, URINE
Creatinine, Urine: 34.8 mg/dL
Protein Creatinine Ratio: 0.56 — ABNORMAL HIGH (ref 0.00–0.15)
Total Protein, Urine: 19.6 mg/dL

## 2013-07-04 LAB — URINALYSIS, DIPSTICK ONLY
Bilirubin Urine: NEGATIVE
Glucose, UA: NEGATIVE mg/dL
Hgb urine dipstick: NEGATIVE
Ketones, ur: 15 mg/dL — AB
Leukocytes, UA: NEGATIVE
Nitrite: NEGATIVE
Protein, ur: NEGATIVE mg/dL
Specific Gravity, Urine: 1.015 (ref 1.005–1.030)
Urobilinogen, UA: 0.2 mg/dL (ref 0.0–1.0)
pH: 7.5 (ref 5.0–8.0)

## 2013-07-04 LAB — CBC
HCT: 34.9 % — ABNORMAL LOW (ref 36.0–46.0)
HCT: 31.4 % — ABNORMAL LOW (ref 36.0–46.0)
HCT: 29.9 % — ABNORMAL LOW (ref 36.0–46.0)
HCT: 27.1 % — ABNORMAL LOW (ref 36.0–46.0)
Hemoglobin: 12 g/dL (ref 12.0–15.0)
Hemoglobin: 10.7 g/dL — ABNORMAL LOW (ref 12.0–15.0)
Hemoglobin: 10 g/dL — ABNORMAL LOW (ref 12.0–15.0)
Hemoglobin: 9.3 g/dL — ABNORMAL LOW (ref 12.0–15.0)
MCH: 29.4 pg (ref 26.0–34.0)
MCH: 29.4 pg (ref 26.0–34.0)
MCH: 28.8 pg (ref 26.0–34.0)
MCH: 29.3 pg (ref 26.0–34.0)
MCHC: 34.4 g/dL (ref 30.0–36.0)
MCHC: 34.1 g/dL (ref 30.0–36.0)
MCHC: 33.4 g/dL (ref 30.0–36.0)
MCHC: 34.3 g/dL (ref 30.0–36.0)
MCV: 85.5 fL (ref 78.0–100.0)
MCV: 86.3 fL (ref 78.0–100.0)
MCV: 86.2 fL (ref 78.0–100.0)
MCV: 85.5 fL (ref 78.0–100.0)
Platelets: 192 10*3/uL (ref 150–400)
Platelets: 190 10*3/uL (ref 150–400)
Platelets: 204 10*3/uL (ref 150–400)
Platelets: 175 10*3/uL (ref 150–400)
RBC: 4.08 MIL/uL (ref 3.87–5.11)
RBC: 3.64 MIL/uL — ABNORMAL LOW (ref 3.87–5.11)
RBC: 3.47 MIL/uL — ABNORMAL LOW (ref 3.87–5.11)
RBC: 3.17 MIL/uL — ABNORMAL LOW (ref 3.87–5.11)
RDW: 12.7 % (ref 11.5–15.5)
RDW: 12.7 % (ref 11.5–15.5)
RDW: 12.7 % (ref 11.5–15.5)
RDW: 12.6 % (ref 11.5–15.5)
WBC: 17.3 10*3/uL — ABNORMAL HIGH (ref 4.0–10.5)
WBC: 26.5 10*3/uL — ABNORMAL HIGH (ref 4.0–10.5)
WBC: 29.2 10*3/uL — ABNORMAL HIGH (ref 4.0–10.5)
WBC: 23.3 10*3/uL — ABNORMAL HIGH (ref 4.0–10.5)

## 2013-07-04 LAB — ABO/RH: ABO/RH(D): O POS

## 2013-07-04 LAB — TYPE AND SCREEN
ABO/RH(D): O POS
Antibody Screen: NEGATIVE

## 2013-07-04 LAB — RPR: RPR Ser Ql: NONREACTIVE

## 2013-07-04 MED ORDER — DIPHENHYDRAMINE HCL 50 MG/ML IJ SOLN: 12.5000 mg | INTRAMUSCULAR | Status: DC | PRN

## 2013-07-04 MED ORDER — OXYTOCIN 40 UNITS IN LACTATED RINGERS INFUSION - SIMPLE MED
62.5000 mL/h | INTRAVENOUS | Status: DC
  Filled 2013-07-04: qty 1000

## 2013-07-04 MED ORDER — DIPHENHYDRAMINE HCL 25 MG PO CAPS: 25.0000 mg | ORAL_CAPSULE | Freq: Four times a day (QID) | ORAL | Status: DC | PRN

## 2013-07-04 MED ORDER — EPHEDRINE 5 MG/ML INJ
10.0000 mg | INTRAVENOUS | Status: DC | PRN
  Filled 2013-07-04: qty 2
  Filled 2013-07-04: qty 4

## 2013-07-04 MED ORDER — PRENATAL MULTIVITAMIN CH
1.0000 | ORAL_TABLET | Freq: Every day | ORAL | Status: DC
  Administered 2013-07-05 – 2013-07-06 (×2): 1 via ORAL
  Filled 2013-07-04 (×2): qty 1

## 2013-07-04 MED ORDER — MEDROXYPROGESTERONE ACETATE 150 MG/ML IM SUSP: 150.0000 mg | INTRAMUSCULAR | Status: DC | PRN

## 2013-07-04 MED ORDER — IBUPROFEN 600 MG PO TABS
600.0000 mg | ORAL_TABLET | Freq: Four times a day (QID) | ORAL | Status: DC | PRN
  Administered 2013-07-04 – 2013-07-05 (×2): 600 mg via ORAL
  Filled 2013-07-04 (×4): qty 1

## 2013-07-04 MED ORDER — LACTATED RINGERS IV SOLN: 500.0000 mL | Freq: Once | INTRAVENOUS | Status: DC

## 2013-07-04 MED ORDER — ZOLPIDEM TARTRATE 5 MG PO TABS: 5.0000 mg | ORAL_TABLET | Freq: Every evening | ORAL | Status: DC | PRN

## 2013-07-04 MED ORDER — ACETAMINOPHEN 325 MG PO TABS: 650.0000 mg | ORAL_TABLET | ORAL | Status: DC | PRN

## 2013-07-04 MED ORDER — TRIMETHOPRIM 100 MG PO TABS
100.0000 mg | ORAL_TABLET | Freq: Two times a day (BID) | ORAL | Status: AC
  Administered 2013-07-04: 100 mg via ORAL
  Filled 2013-07-04: qty 1

## 2013-07-04 MED ORDER — PHENYLEPHRINE 40 MCG/ML (10ML) SYRINGE FOR IV PUSH (FOR BLOOD PRESSURE SUPPORT)
80.0000 ug | PREFILLED_SYRINGE | INTRAVENOUS | Status: DC | PRN
  Filled 2013-07-04: qty 2
  Filled 2013-07-04: qty 5

## 2013-07-04 MED ORDER — BENZOCAINE-MENTHOL 20-0.5 % EX AERO
1.0000 "application " | INHALATION_SPRAY | CUTANEOUS | Status: DC | PRN
  Administered 2013-07-04: 1 via TOPICAL
  Filled 2013-07-04: qty 56

## 2013-07-04 MED ORDER — WITCH HAZEL-GLYCERIN EX PADS: 1.0000 "application " | MEDICATED_PAD | CUTANEOUS | Status: DC | PRN

## 2013-07-04 MED ORDER — SENNOSIDES-DOCUSATE SODIUM 8.6-50 MG PO TABS
2.0000 | ORAL_TABLET | ORAL | Status: DC
  Administered 2013-07-05: 2 via ORAL

## 2013-07-04 MED ORDER — MEASLES, MUMPS & RUBELLA VAC ~~LOC~~ INJ: 0.5000 mL | INJECTION | Freq: Once | SUBCUTANEOUS | Status: DC

## 2013-07-04 MED ORDER — TETANUS-DIPHTH-ACELL PERTUSSIS 5-2.5-18.5 LF-MCG/0.5 IM SUSP
0.5000 mL | Freq: Once | INTRAMUSCULAR | Status: AC
  Administered 2013-07-06: 0.5 mL via INTRAMUSCULAR
  Filled 2013-07-04: qty 0.5

## 2013-07-04 MED ORDER — OXYCODONE-ACETAMINOPHEN 5-325 MG PO TABS: 1.0000 | ORAL_TABLET | ORAL | Status: DC | PRN

## 2013-07-04 MED ORDER — LACTATED RINGERS IV SOLN: 500.0000 mL | INTRAVENOUS | Status: DC | PRN

## 2013-07-04 MED ORDER — SIMETHICONE 80 MG PO CHEW: 80.0000 mg | CHEWABLE_TABLET | ORAL | Status: DC | PRN

## 2013-07-04 MED ORDER — ONDANSETRON HCL 4 MG/2ML IJ SOLN: 4.0000 mg | Freq: Four times a day (QID) | INTRAMUSCULAR | Status: DC | PRN

## 2013-07-04 MED ORDER — LIDOCAINE HCL (PF) 1 % IJ SOLN
30.0000 mL | INTRAMUSCULAR | Status: AC | PRN
  Administered 2013-07-04: 30 mL via SUBCUTANEOUS
  Filled 2013-07-04 (×2): qty 30

## 2013-07-04 MED ORDER — PHENYLEPHRINE 40 MCG/ML (10ML) SYRINGE FOR IV PUSH (FOR BLOOD PRESSURE SUPPORT)
80.0000 ug | PREFILLED_SYRINGE | INTRAVENOUS | Status: DC | PRN
  Filled 2013-07-04: qty 2

## 2013-07-04 MED ORDER — KETOROLAC TROMETHAMINE 30 MG/ML IJ SOLN
30.0000 mg | Freq: Four times a day (QID) | INTRAMUSCULAR | Status: AC
  Administered 2013-07-04 (×2): 30 mg via INTRAVENOUS
  Filled 2013-07-04 (×2): qty 1

## 2013-07-04 MED ORDER — ONDANSETRON HCL 4 MG PO TABS: 4.0000 mg | ORAL_TABLET | ORAL | Status: DC | PRN

## 2013-07-04 MED ORDER — OXYTOCIN BOLUS FROM INFUSION
500.0000 mL | INTRAVENOUS | Status: DC
  Administered 2013-07-04: 500 mL via INTRAVENOUS

## 2013-07-04 MED ORDER — MISOPROSTOL 200 MCG PO TABS
ORAL_TABLET | ORAL | Status: AC
  Filled 2013-07-04: qty 4

## 2013-07-04 MED ORDER — DIBUCAINE 1 % RE OINT: 1.0000 "application " | TOPICAL_OINTMENT | RECTAL | Status: DC | PRN

## 2013-07-04 MED ORDER — LANOLIN HYDROUS EX OINT: TOPICAL_OINTMENT | CUTANEOUS | Status: DC | PRN

## 2013-07-04 MED ORDER — LACTATED RINGERS IV SOLN
INTRAVENOUS | Status: DC
  Administered 2013-07-04 – 2013-07-05 (×2): via INTRAVENOUS

## 2013-07-04 MED ORDER — EPHEDRINE 5 MG/ML INJ
10.0000 mg | INTRAVENOUS | Status: DC | PRN
  Filled 2013-07-04: qty 2

## 2013-07-04 MED ORDER — ONDANSETRON HCL 4 MG/2ML IJ SOLN: 4.0000 mg | INTRAMUSCULAR | Status: DC | PRN

## 2013-07-04 MED ORDER — FENTANYL 2.5 MCG/ML BUPIVACAINE 1/10 % EPIDURAL INFUSION (WH - ANES)
14.0000 mL/h | INTRAMUSCULAR | Status: DC | PRN
  Filled 2013-07-04: qty 125

## 2013-07-04 MED ORDER — CITRIC ACID-SODIUM CITRATE 334-500 MG/5ML PO SOLN: 30.0000 mL | ORAL | Status: DC | PRN

## 2013-07-04 MED ORDER — IBUPROFEN 600 MG PO TABS
600.0000 mg | ORAL_TABLET | Freq: Four times a day (QID) | ORAL | Status: DC
  Administered 2013-07-05 – 2013-07-06 (×5): 600 mg via ORAL
  Filled 2013-07-04 (×4): qty 1

## 2013-07-04 MED ORDER — MISOPROSTOL 200 MCG PO TABS
800.0000 ug | ORAL_TABLET | Freq: Once | ORAL | Status: AC
  Administered 2013-07-04: 800 ug via RECTAL

## 2013-07-04 NOTE — Progress Notes (Signed)
Notified pt in MAU for labor eval, 8cm/95%/-2, intact. GBS negative. Orders to admit, may have epidural

## 2013-07-04 NOTE — MAU Note (Signed)
Pt states having ctx's all weekend, was 1-2 cm at last office appt. Denies gush of fluid, notes bloody show, lost mucus plug. Ctx's q1-2 minutes per pt

## 2013-07-04 NOTE — Progress Notes (Signed)
1430 Fundus firm one below ,steady trickle of blood with fundal massage coming from vagina. No laceration observed on the perineum. Bladder emptied  prior to her arrival at 1400. Fundus midline no deviation. 1445 Dr. Stefano Gaul called to evaluate. 1500 Dr. Stefano Gaul at bedside to evaluate, pt needed to use restroom, no complaints of dizziness, stedy used to take pt to restroom. While pt voiding she became dizzy and did not feel well, called for assistance and prior to arrival of assistance  pt fainted. Ammonia used and pt came around.  1513 pt able to get back on stedy  when she fainted again, ammonia used and pt came around. 1515 pt back in bed, head of bed lowered. LR started @ 200 ml/hr. 1520 speculum exam by Dr. Stefano Gaul. 1528 14 fr foley inserted. Vital signs on flow sheet and stable.1535 Will continue to monitor. No trickle of blood noted with fundal massage.

## 2013-07-04 NOTE — H&P (Addendum)
Admission History and Physical Exam for an Obstetrics Patient  Rebekah Bartlett is a 32 y.o. female, G2P1001, at [redacted]w[redacted]d gestation, who presents for management of active labor. She has been followed at the [facility]cology division of [facility].  Her pregnancy has been complicated by a history of a small for gestational age infant. This infant had a right sided chest mass measuring 2.6x2x1.9cm. The mass appears consistent with a CPAM. The CVR is 0.25. The remainder of the limited anatomy survey is normal. There was no mediastinal shift and no evidence of hydrops. The patient and infant were evaluated by maternal fetal medicine. The mass resolved throughout the course of the pregnancy. A chest x-ray was recommended after delivery. See history below.  OB History   Grav Para Term Preterm Abortions TAB SAB Ect Mult Living   2 2 2  0 0 0 0 0 0 2     Obstetric Comments   2012-CLOSE F/U OF UMBILICAL CORD AND FETAL WT-NST'S      Past Medical History  Diagnosis Date  . Infection     YEAST X 1    Prescriptions prior to admission  Medication Sig Dispense Refill  . acetaminophen (TYLENOL) 500 MG tablet Take 1,000 mg by mouth every 6 (six) hours as needed for pain (For headache.).      Marland Kitchen Prenatal Vit-Fe Fumarate-FA (PRENATAL MULTIVITAMIN) TABS tablet Take 1 tablet by mouth daily with supper.        Past Surgical History  Procedure Laterality Date  . No past surgeries      Allergies  Allergen Reactions  . Codeine Hives and Swelling    SWELLING IS OF THE FACE  . Penicillins Hives and Swelling    SWELLING IS OF THE FACE    Family History: family history includes Asthma in her maternal grandmother; Cancer in her paternal uncle; Drug abuse in her father, maternal uncle, paternal uncle, and paternal uncle; Hypertension in her father, maternal grandfather, and maternal grandmother; Seizures in her sister.  Social History:  reports that she has never  smoked. She has never used smokeless tobacco. She reports that she does not drink alcohol or use illicit drugs.  Review of systems: Normal pregnancy complaints.  Admission Physical Exam:  Dilation: 10 Effacement (%): 100 Station: +1 Exam by:: dr Stefano Gaul Body mass index is 30.62 kg/(m^2).  Blood pressure 104/71, pulse 103, temperature 98.7 F (37.1 C), temperature source Oral, resp. rate 18, height 5\' 5"  (1.651 m), weight 184 lb (83.462 kg), last menstrual period 09/30/2012, SpO2 98.00%, unknown if currently breastfeeding.  Abdomen:             Gravid and nontender Extremities:          Grossly normal Neurologic exam: Grossly normal Pelvic exam:         Cervix: 8 cm on evaluated by the admission nurse  Prenatal labs: ABO, Rh:             --/--/O POS (09/15 1229) HBsAg:                 NEGATIVE (01/22 1416)  HIV:                       NON REACTIVE (01/22 1416)  GBS:                       negative Antibody:  NEG (09/15 1229) Rubella:                  immune RPR:                    NON REACTIVE (09/15 1035)      Prenatal Transfer Tool  Maternal Diabetes: No Genetic Screening: Declined Maternal Ultrasounds/Referrals: See history and physical exam above Fetal Ultrasounds or other Referrals:  See history and physical exam above Maternal Substance Abuse:  No Significant Maternal Medications:  None Significant Maternal Lab Results:  None Other Comments:  None  Assessment:  [redacted]w[redacted]d gestation  Active labor  History of a small for gestational age infant  Chest mass in the infant that resolved through the course of the pregnancy  Plan:  Vaginal delivery anticipated  Chest x-ray of the infant per recommendation of maternal fetal medicine.   Anaira Seay V 07/04/2013, 7:50 PM

## 2013-07-04 NOTE — Progress Notes (Signed)
Rebekah Bartlett is a 32 y.o. year old female. She had a vaginal delivery today of a healthy infant. She experienced bleeding after her delivery and received 800 mcg of Cytotec in the rectum. She was transferred to the postpartum floor in stable condition. She was evaluated by her nurse and was thought to have a steady amount of bleeding. I was called to evaluate the patient. Upon arrival, the patient requested to go to the bathroom to void. Once in the bathroom she had a syncopal episode. She did not totally lose consciousness. She was aware of her surroundings. She was assisted to her bed. At that point she had another syncopal episode. This was accompanied by clonic facial movements that may have been consistent with a seizure. The episode lasted 10 seconds. The patient did not recall the abnormal facial movements, but she did recall the circumstances before and after the event. The patient was given a fluid bolus. Labs were drawn. A Foley catheter was placed in the bladder under sterile conditions. A urine sample was sent for protein to creatinine ratio.  Subjective:  The patient complained of uterine cramping. She complained of dizziness during the above episode, but at this point is feeling much better.  Objective:  BP 104/71  Pulse 103  Temp(Src) 98.7 F (37.1 C) (Oral)  Resp 18  Ht 5\' 5"  (1.651 m)  Wt 184 lb (83.462 kg)  BMI 30.62 kg/m2  SpO2 98%  LMP 09/30/2012   Results for orders placed during the hospital encounter of 07/04/13 (from the past 24 hour(s))  CBC     Status: Abnormal   Collection Time    07/04/13 10:35 AM      Result Value Range   WBC 17.3 (*) 4.0 - 10.5 K/uL   RBC 4.08  3.87 - 5.11 MIL/uL   Hemoglobin 12.0  12.0 - 15.0 g/dL   HCT 40.1 (*) 02.7 - 25.3 %   MCV 85.5  78.0 - 100.0 fL   MCH 29.4  26.0 - 34.0 pg   MCHC 34.4  30.0 - 36.0 g/dL   RDW 66.4  40.3 - 47.4 %   Platelets 192  150 - 400 K/uL  RPR     Status: None   Collection Time    07/04/13 10:35 AM      Result Value Range   RPR NON REACTIVE  NON REACTIVE  ABO/RH     Status: None   Collection Time    07/04/13 10:35 AM      Result Value Range   ABO/RH(D) O POS    TYPE AND SCREEN     Status: None   Collection Time    07/04/13 12:29 PM      Result Value Range   ABO/RH(D) O POS     Antibody Screen NEG     Sample Expiration 07/07/2013    CBC     Status: Abnormal   Collection Time    07/04/13  1:28 PM      Result Value Range   WBC 26.5 (*) 4.0 - 10.5 K/uL   RBC 3.64 (*) 3.87 - 5.11 MIL/uL   Hemoglobin 10.7 (*) 12.0 - 15.0 g/dL   HCT 25.9 (*) 56.3 - 87.5 %   MCV 86.3  78.0 - 100.0 fL   MCH 29.4  26.0 - 34.0 pg   MCHC 34.1  30.0 - 36.0 g/dL   RDW 64.3  32.9 - 51.8 %   Platelets 190  150 - 400 K/uL  CBC     Status: Abnormal   Collection Time    07/04/13  3:23 PM      Result Value Range   WBC 29.2 (*) 4.0 - 10.5 K/uL   RBC 3.47 (*) 3.87 - 5.11 MIL/uL   Hemoglobin 10.0 (*) 12.0 - 15.0 g/dL   HCT 16.1 (*) 09.6 - 04.5 %   MCV 86.2  78.0 - 100.0 fL   MCH 28.8  26.0 - 34.0 pg   MCHC 33.4  30.0 - 36.0 g/dL   RDW 40.9  81.1 - 91.4 %   Platelets 204  150 - 400 K/uL  COMPREHENSIVE METABOLIC PANEL     Status: Abnormal   Collection Time    07/04/13  3:23 PM      Result Value Range   Sodium 130 (*) 135 - 145 mEq/L   Potassium 3.6  3.5 - 5.1 mEq/L   Chloride 96  96 - 112 mEq/L   CO2 22  19 - 32 mEq/L   Glucose, Bld 118 (*) 70 - 99 mg/dL   BUN 4 (*) 6 - 23 mg/dL   Creatinine, Ser 7.82 (*) 0.50 - 1.10 mg/dL   Calcium 8.6  8.4 - 95.6 mg/dL   Total Protein 5.8 (*) 6.0 - 8.3 g/dL   Albumin 2.4 (*) 3.5 - 5.2 g/dL   AST 19  0 - 37 U/L   ALT 13  0 - 35 U/L   Alkaline Phosphatase 78  39 - 117 U/L   Total Bilirubin 0.3  0.3 - 1.2 mg/dL   GFR calc non Af Amer >90  >90 mL/min   GFR calc Af Amer >90  >90 mL/min  PROTEIN / CREATININE RATIO, URINE     Status: Abnormal   Collection Time    07/04/13  3:35 PM      Result Value Range   Creatinine, Urine 34.80     Total Protein, Urine 19.6      PROTEIN CREATININE RATIO 0.56 (*) 0.00 - 0.15   Chest: Clear Heart: Regular rate and rhythm Abdomen: Soft and nontender. Fundus is firm Extremities: Within normal limits Speculum exam: No active bleeding in the vagina or from the cervix. No lacerations noted in the vagina or of the cervix.  Assessment:  Postpartum day 0  Syncopal episode: Probably due to postural hypotension  Clonic facial motions that I do not believe represent seizure activity at this point. Even though her protein to creatinine ratio is elevated, there is no other findings consistent with preeclampsia or eclampsia.  Anemia  Plan:  The patient has a Foley catheter in place. We will remove in the morning if the patient is stable. Repeat a CBC at 8:00 PM tonight. Continue to monitor. If the patient remains stable, then we will plan routine postpartum care.  We will check a urinalysis to see if there are other explanations for proteinuria.  Mylinda Latina.D.

## 2013-07-05 LAB — CBC
HCT: 24.2 % — ABNORMAL LOW (ref 36.0–46.0)
Hemoglobin: 8.4 g/dL — ABNORMAL LOW (ref 12.0–15.0)
MCH: 30.1 pg (ref 26.0–34.0)
MCHC: 34.7 g/dL (ref 30.0–36.0)
MCV: 86.7 fL (ref 78.0–100.0)
Platelets: 159 10*3/uL (ref 150–400)
RBC: 2.79 MIL/uL — ABNORMAL LOW (ref 3.87–5.11)
RDW: 13 % (ref 11.5–15.5)
WBC: 17.7 10*3/uL — ABNORMAL HIGH (ref 4.0–10.5)

## 2013-07-05 NOTE — Progress Notes (Signed)
Patient walked to bathroom without dizziness. Had RN stand by assist. Did not feel confident in her ability to sit/rise from toilet or to walk frequently to bathroom if foley were to be D/C at this time. Will leave foley in place until physician rounds.

## 2013-07-05 NOTE — Progress Notes (Addendum)
Almond Lint, CNM called to check on patient status.  VS stable, one 2x2 cm clot on ice pack at 2330, fundus firm at U/2; able to stand at bedside without dizziness at 2330. New orders: VS Q4 hours, hold Tdap for now, discontinue foley at 0600 if patient stable.

## 2013-07-05 NOTE — Lactation Note (Signed)
This note was copied from the chart of Girl Riyana Mccarver. Lactation Consultation Note  Patient Name: Girl Eastyn Skalla WGNFA'O Date: 07/05/2013 Reason for consult: Follow-up assessment of this experienced breastfeeding mom and baby, now 31 hours after delivery.  Mom states that baby has been latching well and is now exclusively breastfeeding for 10-60 minutes at each feeding without problems.  Baby's output is wnl for this hour of life.  LC encouraged mom to continue cue feedings and to call for [facility] if needed.   Maternal Data    Feeding Feeding Type: Breast Milk Length of feed: 25 min  LATCH Score/Interventions Latch: Grasps breast easily, tongue down, lips flanged, rhythmical sucking.  Audible Swallowing: None  Type of Nipple: Everted at rest and after stimulation  Comfort (Breast/Nipple): Soft / non-tender     Hold (Positioning): No assistance needed to correctly position infant at breast.  LATCH Score: 8  Lactation Tools Discussed/Used   Cue feedings ad lib  Consult Status Consult Status: Follow-up Date: 07/06/13 Follow-up type: In-patient    Warrick Parisian [facility] 07/05/2013, 6:33 PM

## 2013-07-05 NOTE — Progress Notes (Signed)
Post Partum Day 1 Subjective:  Well. Lochia are normal. Foley draining well, ambulating, tolerating normal diet. No symptoms of anemia. nursing going well.  Objective: Blood pressure 92/60, pulse 80, temperature 97.8 F (36.6 C), temperature source Oral, resp. rate 18, height 5\' 5"  (1.651 m), weight 184 lb (83.462 kg), last menstrual period 09/30/2012, SpO2 100.00%, unknown if currently breastfeeding.  Physical Exam:  General: normal Lochia: appropriate Uterine Fundus: 0/3 firm non-tender  Extremities: No evidence of DVT seen on physical exam. Edema minimal     Recent Labs  07/04/13 1945 07/05/13 0630  HGB 9.3* 8.4*  HCT 27.1* 24.2*    Assessment/Plan: Normal Post-partum. Continue routine post-partum care. Anticipate discharge tomorrow    LOS: 1 day   Natalie Leclaire A MD 07/05/2013, 10:08 AM

## 2013-07-06 MED ORDER — IBUPROFEN 600 MG PO TABS: 600.0000 mg | ORAL_TABLET | Freq: Four times a day (QID) | ORAL | Status: AC | PRN

## 2013-07-06 NOTE — Discharge Summary (Signed)
Obstetric Discharge Summary  Reason for Admission: onset of labor Prenatal Procedures: none Intrapartum Procedures: spontaneous vaginal delivery by Marline Backbone MD Postpartum Procedures: none Complications-Operative and Postpartum: none  Hemoglobin  Date Value Range Status  07/05/2013 8.4* 12.0 - 15.0 g/dL Final     HCT  Date Value Range Status  07/05/2013 24.2* 36.0 - 46.0 % Final    Discharge Diagnoses: Term Pregnancy-delivered  Discharge Information:  Date: 07/06/2013 Activity: unrestricted Diet: routine Medications: Ibuprofen, Colace and iron supplement Condition: stable  Breastfeeding: yes  Instructions: refer to practice specific booklet Discharge to: home   Newborn Data: Live born  Information for the patient's newborn:  Fallynn, Gravett [[id number]]  female Post-natal chest X-Ray did not show previous chest mass Home with mother.  Jais Demir A MD 07/06/2013, 10:15 AM

## 2013-08-25 ENCOUNTER — Other Ambulatory Visit: Payer: Self-pay

## 2014-08-21 ENCOUNTER — Encounter (HOSPITAL_COMMUNITY): Payer: Self-pay | Admitting: *Deleted

## 2014-10-20 NOTE — L&D Delivery Note (Signed)
VAGINAL DELIVERY NOTE:  Date of Delivery: 06/24/2015 Primary OB: [facility]  Gestational Age/EDD: [redacted]w[redacted]d 07/01/2015, by Last Menstrual Period Antepartum complications: none Attending Physician: Annamarie Major, MD, FACOG Delivery Type: spontaneous vaginal delivery  Anesthesia: none Laceration: 1st degree Episiotomy: none  Placenta: spontaneous Intrapartum complications: None Estimated Blood Loss: 200 mL GBS: neg Procedure Details: Fast progression from 3 - 10 cm, a few pushes and subsequent delivery; 1st degree encountered/ repaired; placenta rapidly delivers spontaneously as well.  Baby: Liveborn female, Apgars 9/9, weight 6 #, 10 oz,

## 2014-11-07 LAB — OB RESULTS CONSOLE HIV ANTIBODY (ROUTINE TESTING): HIV: NONREACTIVE

## 2014-11-07 LAB — OB RESULTS CONSOLE RPR: RPR: NONREACTIVE

## 2015-04-13 LAB — OB RESULTS CONSOLE ABO/RH: RH Type: POSITIVE

## 2015-04-13 LAB — OB RESULTS CONSOLE RPR: RPR: NONREACTIVE

## 2015-04-13 LAB — OB RESULTS CONSOLE ANTIBODY SCREEN: Antibody Screen: NEGATIVE

## 2015-04-13 LAB — OB RESULTS CONSOLE HIV ANTIBODY (ROUTINE TESTING): HIV: NONREACTIVE

## 2015-04-13 LAB — OB RESULTS CONSOLE VARICELLA ZOSTER ANTIBODY, IGG: Varicella: IMMUNE

## 2015-04-13 LAB — OB RESULTS CONSOLE HEPATITIS B SURFACE ANTIGEN: Hepatitis B Surface Ag: NEGATIVE

## 2015-06-08 LAB — OB RESULTS CONSOLE GC/CHLAMYDIA
Chlamydia: NEGATIVE
Gonorrhea: NEGATIVE

## 2015-06-08 LAB — OB RESULTS CONSOLE GBS: GBS: NEGATIVE

## 2015-06-23 ENCOUNTER — Inpatient Hospital Stay
Admission: EM | Admit: 2015-06-23 | Discharge: 2015-06-25 | DRG: 775 | Disposition: A | Discharge status: Home / Self Care | Payer: BLUE CROSS/BLUE SHIELD | Attending: Obstetrics & Gynecology | Admitting: Obstetrics & Gynecology

## 2015-06-23 DIAGNOSIS — Z3A39 39 weeks gestation of pregnancy: Secondary | ICD-10-CM | POA: Diagnosis present

## 2015-06-23 DIAGNOSIS — Z880 Allergy status to penicillin: Secondary | ICD-10-CM

## 2015-06-23 DIAGNOSIS — O9902 Anemia complicating childbirth: Secondary | ICD-10-CM | POA: Diagnosis present

## 2015-06-23 DIAGNOSIS — Z3490 Encounter for supervision of normal pregnancy, unspecified, unspecified trimester: Secondary | ICD-10-CM

## 2015-06-23 DIAGNOSIS — O479 False labor, unspecified: Secondary | ICD-10-CM | POA: Diagnosis present

## 2015-06-23 DIAGNOSIS — Z885 Allergy status to narcotic agent status: Secondary | ICD-10-CM

## 2015-06-23 MED ORDER — MISOPROSTOL 200 MCG PO TABS
ORAL_TABLET | ORAL | Status: AC
  Filled 2015-06-23: qty 4

## 2015-06-23 MED ORDER — OXYTOCIN 10 UNIT/ML IJ SOLN
INTRAMUSCULAR | Status: AC
  Administered 2015-06-24: 10 [IU]
  Filled 2015-06-23: qty 2

## 2015-06-23 MED ORDER — AMMONIA AROMATIC IN INHA
RESPIRATORY_TRACT | Status: AC
  Filled 2015-06-23: qty 10

## 2015-06-23 MED ORDER — LIDOCAINE HCL (PF) 1 % IJ SOLN
INTRAMUSCULAR | Status: AC
  Administered 2015-06-24: 30 mL
  Filled 2015-06-23: qty 30

## 2015-06-23 MED ORDER — OXYTOCIN 40 UNITS IN LACTATED RINGERS INFUSION - SIMPLE MED
INTRAVENOUS | Status: AC
  Filled 2015-06-23: qty 1000

## 2015-06-23 NOTE — H&P (Signed)
Obstetrics Admission History & Physical   Laboring   HPI:  34 y.o. [id number] @ [redacted]w[redacted]d (07/01/2015, by Last Menstrual Period). Admitted on 06/23/2015:   Patient Active Problem List   Diagnosis Date Noted  . Allergy to penicillin 12/14/2012  . Allergy to codeine 12/14/2012  . SGA (small for gestational age) infant in previous pregnancy 11/10/2012  . H/O anemia 11/10/2012     Presents for Ctxs all day.  Prenatal care at: at [facility]  PMHx:  Past Medical History  Diagnosis Date  . Infection     YEAST X 1   PSHx:  Past Surgical History  Procedure Laterality Date  . No past surgeries     Medications:  Prescriptions prior to admission  Medication Sig Dispense Refill Last Dose  . acetaminophen (TYLENOL) 500 MG tablet Take 1,000 mg by mouth every 6 (six) hours as needed for pain (For headache.).   07/04/2013 at Unknown  . ibuprofen (ADVIL,MOTRIN) 600 MG tablet Take 1 tablet (600 mg total) by mouth every 6 (six) hours as needed (pain scale < 4). 30 tablet 0   . Prenatal Vit-Fe Fumarate-FA (PRENATAL MULTIVITAMIN) TABS tablet Take 1 tablet by mouth daily with supper.   07/03/2013 at Unknown   Allergies: is allergic to codeine and penicillins. OBHx:  OB History  Gravida Para Term Preterm AB SAB TAB Ectopic Multiple Living  0 0 0 0 0 0 2    # Outcome Date GA Lbr Len/2nd Weight Sex Delivery Anes PTL Lv  3 Current           2 Term 07/04/13 [redacted]w[redacted]d 04:50 / 00:19 6 lb 3.3 oz (2.815 kg) F Vag-Spont Local  Y  1 Term 05/06/11 [redacted]w[redacted]d 11:07 / 01:48 6 lb 13.9 oz (3.116 kg) M Vag-Spont EPI  Y     Comments: capit  IOL FOR POSTDATES    Obstetric Comments  2012-CLOSE F/U OF UMBILICAL CORD AND FETAL WT-NST'S   GNF:AOZHYQMV/HQIONGEXBMWU except as detailed in HPI. Soc Hx: Pregnancy welcomed  Objective:  There were no vitals filed for this visit. General: Well nourished, well developed female in no acute distress.  Skin: Warm and dry.  Cardiovascular:Regular rate and rhythm. Respiratory: Clear  to auscultation bilateral. Normal respiratory effort Abdomen: gravid, pain w ctxs Neuro/Psych: Normal mood and affect.   Pelvic exam: is not limited by body habitus EGBUS: within normal limits Vagina: within normal limits and with normal mucosa blood in the vault Cervix: 3 cm Uterus: Spontaneous uterine activity  Adnexa: not evaluated  EFM:FHR: 140 bpm, variability: moderate,  accelerations:  Present,  decelerations:  Absent Toco: Frequency: several times per hour   Assessment & Plan:   34 y.o. [id number] @ [redacted]w[redacted]d, Admitted on 06/23/2015: Rule out labor  Observe for cervical change and Fetal Wellbeing Reassuring

## 2015-06-23 NOTE — OB Triage Note (Signed)
Contractions since 0300.  Denies ROM or VB.   +FM

## 2015-06-24 DIAGNOSIS — Z885 Allergy status to narcotic agent status: Secondary | ICD-10-CM | POA: Diagnosis not present

## 2015-06-24 DIAGNOSIS — Z3490 Encounter for supervision of normal pregnancy, unspecified, unspecified trimester: Secondary | ICD-10-CM

## 2015-06-24 DIAGNOSIS — Z880 Allergy status to penicillin: Secondary | ICD-10-CM | POA: Diagnosis not present

## 2015-06-24 DIAGNOSIS — Z3A39 39 weeks gestation of pregnancy: Secondary | ICD-10-CM | POA: Diagnosis present

## 2015-06-24 DIAGNOSIS — O479 False labor, unspecified: Secondary | ICD-10-CM | POA: Diagnosis present

## 2015-06-24 DIAGNOSIS — O9902 Anemia complicating childbirth: Secondary | ICD-10-CM | POA: Diagnosis present

## 2015-06-24 LAB — CBC
HCT: 30.3 % — ABNORMAL LOW (ref 35.0–47.0)
Hemoglobin: 10.1 g/dL — ABNORMAL LOW (ref 12.0–16.0)
MCH: 29.1 pg (ref 26.0–34.0)
MCHC: 33.2 g/dL (ref 32.0–36.0)
MCV: 87.4 fL (ref 80.0–100.0)
Platelets: 183 10*3/uL (ref 150–440)
RBC: 3.47 MIL/uL — ABNORMAL LOW (ref 3.80–5.20)
RDW: 14.4 % (ref 11.5–14.5)
WBC: 22.3 10*3/uL — ABNORMAL HIGH (ref 3.6–11.0)

## 2015-06-24 MED ORDER — OXYTOCIN 40 UNITS IN LACTATED RINGERS INFUSION - SIMPLE MED
62.5000 mL/h | INTRAVENOUS | Status: DC
  Administered 2015-06-24: 62.5 mL/h via INTRAVENOUS

## 2015-06-24 MED ORDER — LIDOCAINE HCL (PF) 1 % IJ SOLN
30.0000 mL | INTRAMUSCULAR | Status: DC | PRN
  Filled 2015-06-24: qty 30

## 2015-06-24 MED ORDER — DIPHENHYDRAMINE HCL 25 MG PO CAPS: 25.0000 mg | ORAL_CAPSULE | Freq: Four times a day (QID) | ORAL | Status: DC | PRN

## 2015-06-24 MED ORDER — SENNOSIDES-DOCUSATE SODIUM 8.6-50 MG PO TABS
2.0000 | ORAL_TABLET | ORAL | Status: DC
  Administered 2015-06-24: 2 via ORAL
  Filled 2015-06-24: qty 2

## 2015-06-24 MED ORDER — ACETAMINOPHEN 325 MG PO TABS: 650.0000 mg | ORAL_TABLET | ORAL | Status: DC | PRN

## 2015-06-24 MED ORDER — CITRIC ACID-SODIUM CITRATE 334-500 MG/5ML PO SOLN: 30.0000 mL | ORAL | Status: DC | PRN

## 2015-06-24 MED ORDER — OXYCODONE-ACETAMINOPHEN 5-325 MG PO TABS: 1.0000 | ORAL_TABLET | ORAL | Status: DC | PRN

## 2015-06-24 MED ORDER — LACTATED RINGERS IV SOLN
INTRAVENOUS | Status: DC
  Administered 2015-06-24: 125 mL/h via INTRAVENOUS

## 2015-06-24 MED ORDER — IBUPROFEN 600 MG PO TABS
600.0000 mg | ORAL_TABLET | Freq: Four times a day (QID) | ORAL | Status: DC | PRN
  Administered 2015-06-24 – 2015-06-25 (×3): 600 mg via ORAL
  Filled 2015-06-24 (×3): qty 1

## 2015-06-24 MED ORDER — ZOLPIDEM TARTRATE 5 MG PO TABS: 5.0000 mg | ORAL_TABLET | Freq: Every evening | ORAL | Status: DC | PRN

## 2015-06-24 MED ORDER — WITCH HAZEL-GLYCERIN EX PADS: 1.0000 "application " | MEDICATED_PAD | CUTANEOUS | Status: DC | PRN

## 2015-06-24 MED ORDER — LANOLIN HYDROUS EX OINT: TOPICAL_OINTMENT | CUTANEOUS | Status: DC | PRN

## 2015-06-24 MED ORDER — ONDANSETRON HCL 4 MG/2ML IJ SOLN: 4.0000 mg | Freq: Four times a day (QID) | INTRAMUSCULAR | Status: DC | PRN

## 2015-06-24 MED ORDER — SIMETHICONE 80 MG PO CHEW: 80.0000 mg | CHEWABLE_TABLET | ORAL | Status: DC | PRN

## 2015-06-24 MED ORDER — PRENATAL MULTIVITAMIN CH
1.0000 | ORAL_TABLET | Freq: Every day | ORAL | Status: DC
  Administered 2015-06-24: 1 via ORAL
  Filled 2015-06-24: qty 1

## 2015-06-24 MED ORDER — OXYTOCIN 10 UNIT/ML IJ SOLN: 10.0000 [IU] | Freq: Once | INTRAMUSCULAR | Status: DC

## 2015-06-24 MED ORDER — ONDANSETRON HCL 4 MG/2ML IJ SOLN: 4.0000 mg | INTRAMUSCULAR | Status: DC | PRN

## 2015-06-24 MED ORDER — OXYCODONE-ACETAMINOPHEN 5-325 MG PO TABS: 2.0000 | ORAL_TABLET | ORAL | Status: DC | PRN

## 2015-06-24 MED ORDER — DIBUCAINE 1 % RE OINT: 1.0000 "application " | TOPICAL_OINTMENT | RECTAL | Status: DC | PRN

## 2015-06-24 MED ORDER — BENZOCAINE-MENTHOL 20-0.5 % EX AERO: 1.0000 "application " | INHALATION_SPRAY | CUTANEOUS | Status: DC | PRN

## 2015-06-24 MED ORDER — LACTATED RINGERS IV SOLN: INTRAVENOUS | Status: DC

## 2015-06-24 MED ORDER — ONDANSETRON HCL 4 MG PO TABS: 4.0000 mg | ORAL_TABLET | ORAL | Status: DC | PRN

## 2015-06-24 NOTE — Discharge Summary (Signed)
Obstetrical Discharge Summary  Date of Admission: 06/23/2015 Date of Discharge: 06/25/15 Discharge Diagnosis: Term Pregnancy-delivered Primary OB:  [facility]   Gestational Age at Delivery: [redacted]w[redacted]d  Antepartum complications: none Date of Delivery: 06/23/15  Delivered By: Tiburcio Pea Delivery Type: spontaneous vaginal delivery Intrapartum complications/course: None Anesthesia: none Placenta: spontaneous Laceration: 1st degree Episiotomy: none Live born FEMALE Birth Weight:  6-10 APGAR: 9, 9   Post partum course: Since the delivery, patient has tolerate activity, diet, and daily functions without difficulty or complication.  Min lochia.  No breast concerns at this time.  No signs of depression currently.   Postpartum Exam: BP 100/68 mmHg  Pulse 97  Temp(Src) 98.3 F (36.8 C) (Oral)  Resp 18  SpO2 100%   GEN: nad CV: rrr PULM: ctabl, nl effort ABD: s/nd/nt fundus firm and below umbilicus EXT: no edema, ttp bilateral LEs   Disposition: home with infant Rh Immune globulin given: not applicable Rubella vaccine given: no Varicella vaccine given: no Tdap vaccine given in AP or PP setting: given during prenatal care Flu vaccine given in AP or PP setting: not applicable Contraception: vasectomy planned  Prenatal Labs: O//Rubella Immune//RPR negative//HIV negative/HepB Surface Ag negative//plans to breastfeed  Plan:  Rebekah Bartlett was discharged to home in good condition. Follow-up appointment with [facility] provider in 6 weeks  Discharge Medications:   Medication List    TAKE these medications        acetaminophen 500 MG tablet  Commonly known as:  TYLENOL  Take 1,000 mg by mouth every 6 (six) hours as needed for pain (For headache.).     ibuprofen 600 MG tablet  Commonly known as:  ADVIL,MOTRIN  Take 1 tablet (600 mg total) by mouth every 6 (six) hours as needed (pain scale < 4).     lanolin Oint  Apply 1 application topically as needed (for breast care).     prenatal  multivitamin Tabs tablet  Take 1 tablet by mouth daily with supper.        Follow-up arrangements:  Follow-up Information    Follow up with Letitia Libra, MD. Schedule an appointment as soon as possible for a visit in 6 weeks.   Specialty:  Obstetrics and Gynecology   Why:  for routine post partum check   Contact information:   [address] [place] [address] [phone]        ----- Ranae Plumber, MD Attending Obstetrician and Gynecologist [facility]

## 2015-06-24 NOTE — Progress Notes (Signed)
Admit Date: 06/23/2015 Today's Date: 06/24/2015  Post Partum Day 1 (MN delivery)  Subjective:  no complaints  Objective: Temp:  [97.7 F (36.5 C)-98.3 F (36.8 C)] 98.3 F (36.8 C) (09/04 0700) Pulse Rate:  [80-89] 83 (09/04 0700) Resp:  [20] 20 (09/04 0700) BP: (103-124)/(66-81) 103/66 mmHg (09/04 0700) SpO2:  [100 %] 100 % (09/04 0700) Weight:  [185 lb (83.915 kg)] 185 lb (83.915 kg) (09/03 2200)  Physical Exam:  General: alert, cooperative and fatigued Lochia: appropriate Uterine Fundus: firm Incision: none DVT Evaluation: No evidence of DVT seen on physical exam.   Recent Labs  06/24/15 0441  HGB 10.1*  HCT 30.3*    Assessment/Plan: Plan for discharge tomorrow, Breastfeeding, Lactation consult, Contraception (plans vasec) and Infant doing well   LOS: 0 days   Takina Busser [facility] 06/24/2015, 12:07 PM

## 2015-06-25 MED ORDER — LANOLIN HYDROUS EX OINT: 1.0000 "application " | TOPICAL_OINTMENT | CUTANEOUS | Status: DC | PRN

## 2015-06-25 NOTE — Progress Notes (Signed)
Patient discharged home with infant. Discharge instructions, prescriptions and follow up appointment given to and reviewed with patient. Patient verbalized understanding. Escorted out with infant by Melissa Pen[place], NT. 

## 2017-10-07 ENCOUNTER — Encounter: Payer: Self-pay | Admitting: Obstetrics and Gynecology

## 2017-10-07 ENCOUNTER — Ambulatory Visit (INDEPENDENT_AMBULATORY_CARE_PROVIDER_SITE_OTHER): Payer: BLUE CROSS/BLUE SHIELD | Admitting: Obstetrics and Gynecology

## 2017-10-07 VITALS — BP 120/80 | HR 99 | Ht 65.0 in | Wt 165.0 lb

## 2017-10-07 DIAGNOSIS — N644 Mastodynia: Secondary | ICD-10-CM

## 2017-10-07 DIAGNOSIS — Z0000 Encounter for general adult medical examination without abnormal findings: Secondary | ICD-10-CM

## 2017-10-07 DIAGNOSIS — Z13220 Encounter for screening for lipoid disorders: Secondary | ICD-10-CM

## 2017-10-07 NOTE — Patient Instructions (Signed)
I value your feedback and entrusting us with your care. If you get a [facility] patient survey, I would appreciate you taking the time to let us know about your experience today. Thank you! 

## 2017-10-07 NOTE — Progress Notes (Signed)
   Chief Complaint  Patient presents with  . Breast Pain     HPI:      [name] is a 36 y.o. [id number] who LMP was Patient's last menstrual period was 09/22/2017., presents today for breast symptoms. She complains of LT breast tenderness. Sx started 6 month(s) ago after changing to new bra when she stopped breastfeeding. She wanted underwire for breast support and noticed pain in 1 spot under her LT breast when wearing it. Pain was less but persisted after removing bra. She has since changed to other bras and pain has resolved. She thinks there may be a mass where the tenderness is. She drinks caffeine and has mastodynia bilat with menses. No FH breast cancer. Never had mammo.  Would also like fasting labs done.  Past due for annual and plans to schedule.   Past Medical History:  Diagnosis Date  . Infection    YEAST X 1    Past Surgical History:  Procedure Laterality Date  . NO PAST SURGERIES      Family History  Problem Relation Age of Onset  . Drug abuse Father   . Hypertension Father   . Seizures Sister   . Drug abuse Maternal Uncle   . Cancer Paternal Uncle        LEUKEMIA  . Drug abuse Paternal Uncle   . Asthma Maternal Grandmother   . Hypertension Maternal Grandmother   . Hypertension Maternal Grandfather   . Drug abuse Paternal Uncle     No orders of the defined types were placed in this encounter.   ROS:  Review of Systems  Constitutional: Negative for fever, malaise/fatigue and weight loss.  Gastrointestinal: Negative for blood in stool, constipation, diarrhea, nausea and vomiting.  Genitourinary: Negative for dysuria, flank pain, frequency, hematuria and urgency.  Musculoskeletal: Negative for back pain.  Skin: Negative for itching and rash.  Breast ROS: positive for - tenderness   Objective: BP 120/80   Pulse 99   Ht 5\' 5"  (1.651 m)   Wt 165 lb (74.8 kg)   LMP 09/22/2017   BMI 27.46 kg/m    Physical Exam  Constitutional: She is  oriented to person, place, and time. She appears well-developed.  Pulmonary/Chest:  LT BREAST 7:00 POS WITH TENDER AREA, PROMINENT NORMAL BREAST TISSUE; NO MASSES; NO ERYTHEMA; LT BREAST IS SLIGHTLY LARGER THAN RT BREAST    Neurological: She is alert and oriented to person, place, and time. No cranial nerve deficit.  Skin: Skin is warm and dry.  Psychiatric: She has a normal mood and affect. Her behavior is normal. Thought content normal.  Vitals reviewed.   Assessment/Plan: Breast pain, left - Neg exam, sx resolve with different bra use. Since LT breast is larger, bra may be tighter on that side. Cont to wear bras that don't cause pain. Mammo/u/s prn.  Blood tests for routine general physical examination - Plan: Comprehensive metabolic panel, Lipid panel  Screening cholesterol level - Plan: Lipid panel    F/U  Return if symptoms worsen or fail to improve, for annual.  Rebekah Bartlett B. Haydon Dorris, PA-C 10/07/2017 10:50 AM

## 2017-10-08 LAB — COMPREHENSIVE METABOLIC PANEL
ALT: 11 IU/L (ref 0–32)
AST: 12 IU/L (ref 0–40)
Albumin/Globulin Ratio: 1.4 (ref 1.2–2.2)
Albumin: 4.3 g/dL (ref 3.5–5.5)
Alkaline Phosphatase: 54 IU/L (ref 39–117)
BUN/Creatinine Ratio: 12 (ref 9–23)
BUN: 8 mg/dL (ref 6–20)
Bilirubin Total: 0.2 mg/dL (ref 0.0–1.2)
CO2: 24 mmol/L (ref 20–29)
Calcium: 9.3 mg/dL (ref 8.7–10.2)
Chloride: 103 mmol/L (ref 96–106)
Creatinine, Ser: 0.65 mg/dL (ref 0.57–1.00)
GFR calc Af Amer: 133 mL/min/{1.73_m2} (ref >59)
GFR calc non Af Amer: 115 mL/min/{1.73_m2} (ref >59)
Globulin, Total: 3 g/dL (ref 1.5–4.5)
Glucose: 76 mg/dL (ref 65–99)
Potassium: 4.1 mmol/L (ref 3.5–5.2)
Sodium: 141 mmol/L (ref 134–144)
Total Protein: 7.3 g/dL (ref 6.0–8.5)

## 2017-10-08 LAB — LIPID PANEL
Chol/HDL Ratio: 1.9 ratio (ref 0.0–4.4)
Cholesterol, Total: 89 mg/dL — ABNORMAL LOW (ref 100–199)
HDL: 47 mg/dL (ref >39)
LDL Calculated: 33 mg/dL (ref 0–99)
Triglycerides: 45 mg/dL (ref 0–149)
VLDL Cholesterol Cal: 9 mg/dL (ref 5–40)

## 2017-10-14 ENCOUNTER — Telehealth: Payer: Self-pay | Admitting: Obstetrics and Gynecology

## 2017-10-14 DIAGNOSIS — Z130 Encounter for screening for diseases of the blood and blood-forming organs and certain disorders involving the immune mechanism: Secondary | ICD-10-CM

## 2017-10-14 NOTE — Telephone Encounter (Signed)
Pt aware of labs with hypolipidemia. No other labs to compare levels with. Per Up-to-date, check for anemia. Pt with hx of anemia with pregnancy in 2016. Pt to RTO for CBC. Will f/u with results.

## 2024-05-11 ENCOUNTER — Ambulatory Visit: Admitting: Dermatology

## 2024-05-11 ENCOUNTER — Encounter: Payer: Self-pay | Admitting: Dermatology

## 2024-05-11 VITALS — BP 116/81 | HR 75

## 2024-05-11 DIAGNOSIS — R220 Localized swelling, mass and lump, head: Secondary | ICD-10-CM | POA: Diagnosis not present

## 2024-05-11 DIAGNOSIS — O365990 Maternal care for other known or suspected poor fetal growth, unspecified trimester, not applicable or unspecified: Secondary | ICD-10-CM | POA: Insufficient documentation

## 2024-05-11 DIAGNOSIS — D179 Benign lipomatous neoplasm, unspecified: Secondary | ICD-10-CM | POA: Insufficient documentation

## 2024-05-11 DIAGNOSIS — Z862 Personal history of diseases of the blood and blood-forming organs and certain disorders involving the immune mechanism: Secondary | ICD-10-CM | POA: Insufficient documentation

## 2024-05-11 NOTE — Patient Instructions (Addendum)
 Date: Wed May 11 2024  Hello Rebekah Bartlett,  Thank you for visiting today. Here is a summary of the key instructions:  - Hair Care: Try washing your hair once a week to see if it helps manage the forehead growth  - Referrals: Consider a consultation with Dr. Lowery, plastic surgeon, for potential removal of the forehead growth  - Follow-up: If you decide to pursue removal, schedule an appointment with Dr. Lowery for a surgical consultation  Please reach out if you have any questions or concerns.  Warm regards,  Dr. Delon Lenis, Dermatology   Dr. Lowery [facility] [address] [place], [place] [address] [phone] Locate on Map   Important Information   Due to recent changes in healthcare laws, you may see results of your pathology and/or laboratory studies on MyChart before the doctors have had a chance to review them. We understand that in some cases there may be results that are confusing or concerning to you. Please understand that not all results are received at the same time and often the doctors may need to interpret multiple results in order to provide you with the best plan of care or course of treatment. Therefore, we ask that you please give us  2 business days to thoroughly review all your results before contacting the office for clarification. Should we see a critical lab result, you will be contacted sooner.     If You Need Anything After Your Visit   If you have any questions or concerns for your doctor, please call our main line at [phone]. If no one answers, please leave a voicemail as directed and we will return your call as soon as possible. Messages left after 4 pm will be answered the following business day.    You may also send us  a message via MyChart. We typically respond to MyChart messages within 1-2 business days.  For prescription refills, please ask your pharmacy to contact our office. Our fax  number is [phone].  If you have an urgent issue when the clinic is closed that cannot wait until the next business day, you can page your doctor at the number below.     Please note that while we do our best to be available for urgent issues outside of office hours, we are not available 24/7.    If you have an urgent issue and are unable to reach us , you may choose to seek medical care at your doctor's office, retail clinic, urgent care center, or emergency room.   If you have a medical emergency, please immediately call 911 or go to the emergency department. In the event of inclement weather, please call our main line at [phone] for an update on the status of any delays or closures.  Dermatology Medication Tips: Please keep the boxes that topical medications come in in order to help keep track of the instructions about where and how to use these. Pharmacies typically print the medication instructions only on the boxes and not directly on the medication tubes.   If your medication is too expensive, please contact our office at [phone] or send us  a message through MyChart.    We are unable to tell what your co-pay for medications will be in advance as this is different depending on your insurance coverage. However, we may be able to find a substitute medication at lower cost or fill out paperwork to get insurance to cover a needed medication.  If a prior authorization is required to get your medication covered by your insurance company, please allow us  1-2 business days to complete this process.   Drug prices often vary depending on where the prescription is filled and some pharmacies may offer cheaper prices.   The website www.goodrx.com contains coupons for medications through different pharmacies. The prices here do not account for what the cost may be with help from insurance (it may be cheaper with your insurance), but the website can give you the price if you did not use  any insurance.  - You can print the associated coupon and take it with your prescription to the pharmacy.  - You may also stop by our office during regular business hours and pick up a GoodRx coupon card.  - If you need your prescription sent electronically to a different pharmacy, notify our office through [facility] or by phone at [phone]

## 2024-05-11 NOTE — Progress Notes (Signed)
   New Patient Visit   Subjective  Rebekah Bartlett is a 43 y.o. female who presents for the following: Lipoma  Patient states she has growth located at the forehead that she would like to have examined. Patient reports the areas have been there for several years. She reports the areas are not bothersome. Patient reports she has the current growth not previously been treated for these areas.   The following portions of the chart were reviewed this encounter and updated as appropriate: medications, allergies, medical history  Review of Systems:  No other skin or systemic complaints except as noted in HPI or Assessment and Plan.  Objective  Well appearing patient in no apparent distress; mood and affect are within normal limits.  A focused examination was performed of the following areas: Forehead  Relevant exam findings are noted in the Assessment and Plan.       2.5 subcutaneous nodule located at the central forehead    Assessment & Plan   1. Subcutaneous nodule on central forehead - Assessment: 2.5 cm soft subcutaneous nodule identified on the central forehead. Primary differential diagnosis is a lipoma (80% likelihood), with sebaceous cyst as secondary consideration (20% likelihood). Nodule fluctuates in size regularly but is not associated with pain. Patient reports washing hair every 2 weeks, potentially contributing to size fluctuation. 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 Nodule not easily noticeable when patient's hair is styled to the side.  - Plan:    Refer to plastic surgeon Dr. Lowery for potential surgical removal if patient decides to proceed    Informed consent discussion:     - Surgical removal is the only effective treatment but will leave a scar     - Risk of hypertrophic or keloid scarring, especially in people of color     -    - Risk of recurrence even with complete removal    Recommend increasing hair washing  frequency to once a week to potentially manage fluctuations    Patient to consider options and decide whether to keep or remove the nodule    No follow-ups on file.  I, Jetta Ager, am acting as Neurosurgeon for [employer], DO.  Documentation: I have reviewed the above documentation for accuracy and completeness, and I agree with the above.  Delon Lenis, DO

## 2024-10-02 ENCOUNTER — Other Ambulatory Visit: Payer: Self-pay

## 2024-10-02 ENCOUNTER — Emergency Department
Admission: EM | Admit: 2024-10-02 | Discharge: 2024-10-02 | Disposition: A | Discharge status: Home / Self Care | Attending: Emergency Medicine | Admitting: Emergency Medicine

## 2024-10-02 DIAGNOSIS — S61111A Laceration without foreign body of right thumb with damage to nail, initial encounter: Secondary | ICD-10-CM | POA: Insufficient documentation

## 2024-10-02 DIAGNOSIS — Z23 Encounter for immunization: Secondary | ICD-10-CM | POA: Insufficient documentation

## 2024-10-02 DIAGNOSIS — W274XXA Contact with kitchen utensil, initial encounter: Secondary | ICD-10-CM | POA: Insufficient documentation

## 2024-10-02 MED ORDER — TETANUS-DIPHTH-ACELL PERTUSSIS 5-2-15.5 LF-MCG/0.5 IM SUSP
0.5000 mL | Freq: Once | INTRAMUSCULAR | Status: AC
  Administered 2024-10-02: 0.5 mL via INTRAMUSCULAR
  Filled 2024-10-02: qty 0.5

## 2024-10-02 NOTE — ED Triage Notes (Signed)
 Pt comes with c/o right thumb injury. Pt was cutting tomatoes and cut it on the knife. Pt has bandage in placed and bleeding controlled currently.

## 2024-10-02 NOTE — ED Notes (Signed)
 Pt sliced finger while cutting potatoes. Pt rates pain 7/10. Finger is wrapped and bleeding controled at this time.

## 2024-10-02 NOTE — ED Provider Notes (Signed)
 [facility] Provider Note    Event Date/Time   First MD Initiated Contact with Patient 10/02/24 1824     (approximate)   History   Finger Injury   HPI  Rebekah Bartlett is a 43 y.o. female with no PMH presents for evaluation of an injury to her right thumb.  Patient was using a mandolin to slice potatoes when she cut the tip of her right thumb.  Patient presents to the emergency department because she could not stop the bleeding at home.  She is not up-to-date on her tetanus shot.      Physical Exam   Triage Vital Signs: ED Triage Vitals  Encounter Vitals Group     BP 10/02/24 1744 (!) 136/103     Girls Systolic BP Percentile --      Girls Diastolic BP Percentile --      Boys Systolic BP Percentile --      Boys Diastolic BP Percentile --      Pulse Rate 10/02/24 1744 89     Resp 10/02/24 1744 18     Temp 10/02/24 1744 98 F (36.7 C)     Temp src --      SpO2 10/02/24 1744 100 %     Weight 10/02/24 1743 195 lb (88.5 kg)     Height 10/02/24 1743 5' 5 (1.651 m)     Head Circumference --      Peak Flow --      Pain Score 10/02/24 1743 5     Pain Loc --      Pain Education --      Exclude from Growth Chart --     Most recent vital signs: Vitals:   10/02/24 1744  BP: (!) 136/103  Pulse: 89  Resp: 18  Temp: 98 F (36.7 C)  SpO2: 100%   General: Awake, no distress.  CV:  Good peripheral perfusion.  Resp:  Normal effort.  Abd:  No distention.  Other:  Superficial laceration to the radial side of the thumb with a portion of the nail missing, top layer of skin is absent, wound is slowly bleeding, wound is superficial, patient can fully flex and extend the finger.   ED Results / Procedures / Treatments   Labs (all labs ordered are listed, but only abnormal results are displayed) Labs Reviewed - No data to display    PROCEDURES:  Critical Care performed: No  Procedures   MEDICATIONS ORDERED IN ED: Medications  Tdap (ADACEL )  injection 0.5 mL (0.5 mLs Intramuscular Given 10/02/24 1935)     IMPRESSION / MDM / ASSESSMENT AND PLAN / ED COURSE  I reviewed the triage vital signs and the nursing notes.                             42 year old female presents for a right thumb injury.  Blood pressure was a little bit elevated a bit anxious on exam, vital signs stable otherwise.  Differential diagnosis includes, but is not limited to, laceration, abrasion, very low suspicion for vascular, nerve and tendon injury.  Patient's presentation is most consistent with acute, uncomplicated illness.  Patient sliced off the top layer of skin on her thumb and caught the distal corner of the nail as well.  Wound was cleaned using saline, then tourniquet was applied and Dermabond was put over the open wound to stop the bleeding.  Patient was instructed on further wound care.  Her tetanus shot was updated.  Patient voiced understanding, all questions were answered and she was stable at discharge.       FINAL CLINICAL IMPRESSION(S) / ED DIAGNOSES   Final diagnoses:  Laceration of right thumb without foreign body with damage to nail, initial encounter     Rx / DC Orders   ED Discharge Orders     None        Note:  This document was prepared using Dragon voice recognition software and may include unintentional dictation errors.   Cleaster Tinnie LABOR, PA-C 10/02/24 2146    Jacolyn Pae, MD 10/06/24 [phone]

## 2024-10-02 NOTE — Discharge Instructions (Addendum)
 The glue will begin to fall off your finger in about a week.  Do not soak your hands in water as this will cause the glue to breakdown prematurely, but it is okay for you to wash your hands and to shower.  After the glue falls off you can wear a bandage until it is fully healed.  Watch for signs of infection including redness, warmth, swelling, pain and pus drainage.  If you develop any of these please return to the ED, urgent care or your primary care provider.
